# Patient Record
Sex: Female | Born: 1985 | Race: White | Hispanic: Yes | Marital: Married | Smoking: Never smoker
Health system: Southern US, Community
[De-identification: ages and names within clinical notes are randomized; demographics above are authoritative.]

## PROBLEM LIST (undated history)

## (undated) DIAGNOSIS — IMO0002 Reserved for concepts with insufficient information to code with codable children: Secondary | ICD-10-CM

## (undated) HISTORY — PX: NO PAST SURGERIES: SHX2092

## (undated) HISTORY — PX: LEEP: SHX91

## (undated) HISTORY — DX: Reserved for concepts with insufficient information to code with codable children: IMO0002

---

## 2005-03-07 ENCOUNTER — Inpatient Hospital Stay (HOSPITAL_COMMUNITY): Admission: AD | Admit: 2005-03-07 | Discharge: 2005-03-07 | Payer: Self-pay | Admitting: Obstetrics & Gynecology

## 2005-03-08 ENCOUNTER — Inpatient Hospital Stay (HOSPITAL_COMMUNITY): Admission: AD | Admit: 2005-03-08 | Discharge: 2005-03-09 | Payer: Self-pay | Admitting: Obstetrics & Gynecology

## 2005-03-29 ENCOUNTER — Emergency Department (HOSPITAL_COMMUNITY): Admission: EM | Admit: 2005-03-29 | Discharge: 2005-03-29 | Payer: Self-pay | Admitting: *Deleted

## 2005-03-30 ENCOUNTER — Emergency Department (HOSPITAL_COMMUNITY): Admission: EM | Admit: 2005-03-30 | Discharge: 2005-03-30 | Payer: Self-pay | Admitting: Emergency Medicine

## 2011-05-04 DIAGNOSIS — D069 Carcinoma in situ of cervix, unspecified: Secondary | ICD-10-CM | POA: Insufficient documentation

## 2011-07-14 ENCOUNTER — Ambulatory Visit (INDEPENDENT_AMBULATORY_CARE_PROVIDER_SITE_OTHER): Payer: Self-pay | Admitting: Obstetrics & Gynecology

## 2011-07-14 ENCOUNTER — Encounter: Payer: Self-pay | Admitting: *Deleted

## 2011-07-14 VITALS — BP 87/57 | HR 79 | Temp 97.0°F | Ht 63.5 in | Wt 118.3 lb

## 2011-07-14 DIAGNOSIS — D069 Carcinoma in situ of cervix, unspecified: Secondary | ICD-10-CM

## 2011-07-14 NOTE — Progress Notes (Signed)
History:  25 y.o. 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  No other GYN concerns. Spanish interpreter present. The following portions of the patient's history were reviewed and updated as appropriate: allergies, current medications, past family history, past medical history, past social history, past surgical history and problem list.   Objective:  Physical Exam Blood pressure 87/57, pulse 79, temperature 97 F (36.1 C), temperature source Oral, height 5' 3.5" (1.613 m), weight 118 lb 4.8 oz (53.661 kg). Gen: NAD Abd: Soft, nontender and nondistended Pelvic: Normal appearing external genitalia; normal appearing vaginal mucosa and cervix with small ectropion noted.  Normal discharge.  Small uterus, no palpable masses or adnexal tenderness.  Assessment & Plan:  Explained need for LEEP; explained procedure in detail.  Patient to watch LEEP video, Spanish handout about LEEP given to patient. Return to clinic for LEEP soon.

## 2011-07-14 NOTE — Patient Instructions (Signed)
Procedimiento de escisin electroquirrgica con asa (Loop Electrosurgical Excision Procedure) El procedimiento de escisin electroquirrgica con asa es la extirpacin de una porcin de la parte inferior del tero (cuello). El procedimento se realiza cuando hay cambios significativamente anormales en las clulas del cuello del tero. Estos cambios pueden causar cncer si se dejan sin tratar.   El procedimiento mismo slo demora algunos minutos. Generalmente se Electronics engineer del mdico. Se considera un procedimiento seguro para aquellas mujeres que desean o tratan de quedar embarazadas. Solo bajo raras circunstancias este procedimiento se realiza estando embarazada.  INFORME A SU MDICO:   Si est embarazada o no tuvo el ltimo perodo menstrual.   Alergias a alimentos o medicamentos.   Todos los [employer] [place], incluyendo vitaminas, hierbas, gotas oftlmicas, medicamentos de [place] y Control and instrumentation engineer.   Uso de corticoides (por va oral o cremas).   Problemas anteriores debido a anestsicos o a medicamentos que [employer] sensibilidad.   Cirugas ginecolgicas previas.   Antecedentes de hemorragias o cogulos sanguneos.   Infecciones recientes o actuales en la vagina (herpes, enfermedades de transmisin sexual).   Otros problemas de [place].  RIESGOS Y COMPLICACIONES  Sangrado.   Infecciones.   Lesiones en la vagina, la vejiga o el recto.   Obstruccin rara en la abertura del cuello que causa problemas durante la menstruacin (estenosis cervical).  ANTES DEL PROCEDIMIENTO   No tome aspirina ni anticoagulantes durante la semana previa al procedimiento, o segn le hayan indicado.   Consuma una comida ligera antes del procedimiento.   Consulte a su mdico si debe cambiar o suspender los medicamentos que toma habitualmente.   Le administrarn un analgsico 1  2 horas antes del procedimiento.  PROCEDIMIENTO   Se coloca un instrumento (espculo) en la vagina.  Esto le permite al mdico observar el cuello.   Se aplica tintura de yodo para encontrar la zona de las clulas anormales.   Se inyecta un medicamento para adormecer el cuello (anestsico local).    Se pasa electricidad a travs de una delgada asa de alambre que luego se [place] para extirpar (cauterizar) un pequeo segmento de la zona afectada.   Se utiliza una ligera electrocauterizacin para sellar los vasos sanguneos pequeos y Social research officer, government.   Aplicarn una pasta en la zona cauterizada para prevenir el sangrado.   La muestra de tejido se enva al laboratorio. Luego, se examina en el microscopio.  DESPUS DEL PROCEDIMIENTO   Pdale a alguna persona que la lleve hasta su casa.   Puede sentir un clico moderado a suave.   Puede notar una secrecin vaginal negra por la pasta usada para prevenir el sangrado. Esto es normal.   Observe si tiene un sangrado excesivo. Esto requiere atencin mdica inmediata.   Consulte con su mdico la fecha en que los resultados estarn disponibles. Asegrese de [employer].  Document Released: 05/05/2011 [facility] Patient Information 2012 [place], [place].

## 2011-07-28 ENCOUNTER — Ambulatory Visit (INDEPENDENT_AMBULATORY_CARE_PROVIDER_SITE_OTHER): Payer: Self-pay | Admitting: Family Medicine

## 2011-07-28 ENCOUNTER — Encounter: Payer: Self-pay | Admitting: Family Medicine

## 2011-07-28 ENCOUNTER — Other Ambulatory Visit (HOSPITAL_COMMUNITY)
Admission: RE | Admit: 2011-07-28 | Discharge: 2011-07-28 | Disposition: A | Discharge status: Home / Self Care | Payer: Self-pay | Source: Ambulatory Visit | Attending: Family Medicine | Admitting: Family Medicine

## 2011-07-28 DIAGNOSIS — D069 Carcinoma in situ of cervix, unspecified: Secondary | ICD-10-CM | POA: Insufficient documentation

## 2011-07-28 DIAGNOSIS — Z01812 Encounter for preprocedural laboratory examination: Secondary | ICD-10-CM

## 2011-07-28 LAB — POCT PREGNANCY, URINE: Preg Test, Ur: NEGATIVE

## 2011-07-28 NOTE — Patient Instructions (Signed)
Procedimiento de escisin electroquirrgica con asa - Cuidados posteriores  (Loop Electrosurgical Excision Procedure, Care After) Siga estas instrucciones durante las prximas semanas. Estas indicaciones le proporcionan informacin general acerca de cmo deber cuidarse despus del procedimiento. El mdico tambin podr darle instrucciones especficas. El tratamiento ha sido planificado segn las prcticas mdicas actuales, pero en algunos casos pueden ocurrir problemas. Comunquese con el mdico si tiene algn problema o tiene preguntas despus del procedimiento.  INSTRUCCIONES PARA EL CUIDADO EN EL HOGAR   No use tampones, no se d duchas vaginales ni tenga relaciones sexuales durante 2 semanas, o segn lo que le indique su mdico.   Comience con las actividades habituales si no tiene o tiene mnimo de clicos y Landscape architect, excepto que el mdico le indique lo contrario.   Tmese la temperatura si se siente enfermo. Anote la temperatura en un papel e informe a su mdico que tiene fiebre.   Tome todos los medicamentos segn le indic su mdico.   Cumpla con todas las visitas de control y los papanicolau, segn le indique su mdico.  SOLICITE ATENCIN MDICA DE INMEDIATO SI:   Tiene un sangrado ms abundante o que dura ms que el ciclo menstrual normal.   Tiene un sangrado de color rojo brillante.   Elimina cogulos de [place].   Tiene fiebre.   Siente clicos o el dolor no se alivia con Tourist information centre manager.   Siente dolor abdominal que no parece estar relacionado con la misma zona en que sinti los clicos y Chief Technology Officer.   Se siente mareada, dbil o se desmaya.   Comienza a Financial risk analyst al orinar u [employer].   Tiene una secrecin vaginal con mal olor.  ASEGRESE DE QUE:   Comprende estas instrucciones.   Controlar su enfermedad.   Solicitar ayuda de inmediato si no mejora o si empeora.  Document Released: 05/06/2011 [facility] Patient Information 2012 [place] [employer], [place].

## 2011-07-28 NOTE — Progress Notes (Addendum)
  LEEP PROCEDURE NOTE Pap smear and colposcopy reviewed.   Colpo Biopsy:  CIN3  Risks, benefits, alternatives, and limitations of procedure explained to patient, including pain, bleeding, infection, failure to remove abnormal tissue and failure to cure dysplasia, need for repeat procedures, damage to pelvic organs, cervical incompetence.  Role of HPV,cervical dysplasia and need for close followup was empasized. Informed written consent was obtained. All questions were answered. Time out performed.  ??Procedure: The patient was placed in lithotomy position and the bivalved coated speculum was placed in the patient's vagina. A grounding pad placed on the patient. Lugol's solution was applied to the cervix and areas of decreased uptake were noted from 6 o'clock to 1 o'clock.   Local anesthesia was administered via an intracervical block using 10cc of 2% Lidocaine with epinephrine. The suction was turned on and the Medium 1X Fisher Cone Biopsy Excisor on 50 Watts of cutting current was used to excise the area of decreased uptake and excise the entire transformation zone. Excellent hemostasis was achieved using roller ball coagulation set at 50 Watts coagulation current. Monsel's solution was then applied and the speculum was removed from the vagina. Specimens were sent to pathology. ?The patient tolerated the procedure well. Post-operative instructions given to patient, including instruction to seek medical attention for persistent bright red bleeding, fever, abdominal/pelvic pain, dysuria, nausea or vomiting. She was also told about the possibility of having copious yellow to black tinged discharge. She was counseled to avoid anything in the vagina (sex/douching/tampons) for 4-6 weeks. She has a  2-week post-operative check to review results and assess wound healing. Follow up in 4 months for repeat pap or as needed.  Dr Debroah Loop was present for the procedure.

## 2011-08-25 ENCOUNTER — Ambulatory Visit (INDEPENDENT_AMBULATORY_CARE_PROVIDER_SITE_OTHER): Payer: Self-pay | Admitting: Obstetrics and Gynecology

## 2011-08-25 ENCOUNTER — Encounter: Payer: Self-pay | Admitting: Advanced Practice Midwife

## 2011-08-25 VITALS — BP 109/68 | HR 100 | Temp 97.1°F | Resp 16 | Ht 63.0 in | Wt 122.6 lb

## 2011-08-25 DIAGNOSIS — Z23 Encounter for immunization: Secondary | ICD-10-CM

## 2011-08-25 DIAGNOSIS — Z9889 Other specified postprocedural states: Secondary | ICD-10-CM

## 2011-08-25 DIAGNOSIS — Z09 Encounter for follow-up examination after completed treatment for conditions other than malignant neoplasm: Secondary | ICD-10-CM

## 2011-08-25 MED ORDER — INFLUENZA VIRUS VACC SPLIT PF IM SUSP
0.5000 mL | INTRAMUSCULAR | Status: AC | PRN
  Administered 2011-08-25: 0.5 mL via INTRAMUSCULAR

## 2011-08-25 NOTE — Patient Instructions (Signed)
Conizacin cervical, Cuidado posterior (Conization of the Cervix, Care After) Estas instrucciones le darn informacin acerca de qu hacer al dejar el hospital. El profesional que la asiste podr darle instrucciones especficas. Si tiene problemas o surgen preguntas luego de recibir el alta, comunquese con su mdico.  CUIDADOS EN EL HOGAR   Est acompaada por alguien durante 2 a 4 das luego del procedimiento.   No conduzca por 24 horas.   Slo tome los medicamentos segn le haya indicado el profesional. No tome aspirina. Puede ocasionar hemorragias.   Tome duchas durante la primera semana. No tome baos, nade, ni utilice el jacuzzi hasta que el mdico la autorice.   No realice duchas vaginales, no utilice tampones ni tenga relaciones sexuales hasta que el mdico la autorice.   Haga reposo con frecuencia.   Espere una semana antes de retomar sus actividades normales.   No [place], empuje ni tire de nada que pese ms de 10 libras (4,5 kg, aproximadamente el peso de un galn de [place]).   No permanezca parada por mucho tiempo.   Limite los ascensos por escaleras a uno o [employer].   Si no puede ir al bao (constipacin), puede:   Tomar laxantes suaves, segn le haya indicado el mdico.   Agregar frutas y salvado a su dieta.   Beba una cantidad de lquido suficiente como para Pharmacologist la orina de tono claro o color amarillo plido.   Siga con su dieta habitual.   Cumpla con todas las visitas de control, segn le indique su mdico.  SOLICITE AYUDA DE INMEDIATO SI:   Tiene cogulos sanguneos, un sangrado ms abundante que el periodo menstrual, o si el sangrado es de color rojo brillante.   Su temperatura es de 102 F (38.9 C) o mayor.   Se desmaya.   Siente dolor al orinar u observa sangre en la orina.   Comienza a vomitar.   Los [employer].   No consigue Engineer, materials con la medicacin.   Aparece una erupcin cutnea.   Se siente mareada o siente  vrtigo.   Tiene Programme researcher, broadcasting/film/video (nuseas).   Brett Fairy secrecin vaginal con mal olor.   Tiene una reaccin anormal o alrgica al medicamento.   Necesita analgsicos ms fuertes.  ASEGRESE DE QUE:   Comprende estas instrucciones.   Controlar su enfermedad.   Solicitar ayuda de inmediato si no mejora o empeora.  Document Released: 09/25/2010 Document Revised: 05/05/2011 [facility] Patient Information 2012 [place], [place].

## 2011-08-25 NOTE — Progress Notes (Signed)
Leslie Leep Jimenez-Velez25 y.o.G1P1001 Chief Complaint  Patient presents with  . Follow-up    LEEP results    SUBJECTIVE  HPI: Had light bleeding and spotting since procedure. Denies pain or resumption sexual activity.    Past Medical History  Diagnosis Date  . Abnormal Pap smear    History reviewed. No pertinent past surgical history. History   Social History  . Marital Status: Married    Spouse Name: N/A    Number of Children: N/A  . Years of Education: N/A   Occupational History  . Not on file.   Social History Main Topics  . Smoking status: Never Smoker   . Smokeless tobacco: Never Used  . Alcohol Use: No  . Drug Use: No  . Sexually Active: Yes    Birth Control/ Protection: Injection     on depo provera   Other Topics Concern  . Not on file   Social History Narrative  . No narrative on file   Current Outpatient Prescriptions on File Prior to Visit  Medication Sig Dispense Refill  . medroxyPROGESTERone (DEPO-PROVERA) 150 MG/ML injection Inject 150 mg into the muscle every 3 (three) months.         No current facility-administered medications on file prior to visit.   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

## 2012-09-06 NOTE — L&D Delivery Note (Signed)
Delivery Note At 10:38 AM a viable female was delivered via Vaginal, Spontaneous Delivery (Presentation: Middle Occiput Anterior).  APGAR: 9, 9; weight 8 lb 4.8 oz (3765 g).   Placenta status: Intact, Retained Manual removal.  Cord: 3 vessels with the following complications: None.   Anesthesia: Epidural  Episiotomy: None Lacerations: 1st degree;Labial Suture Repair: 3.0 vicryl Est. Blood Loss (mL): 500  Mom to postpartum.  Baby to maternal chest.  Humberto Leep Jimenez-Velez pushed well to deliver viable female infant in LOA position with left hand at face over intact perineum. No nuchal cord present. Infant shoulders and body delivered without difficulty or delay. Infant vigorous, crying and placed on maternal abdomen. 1st degree labial tear bleeding and was repaired with 3.0 Vicryl in the usual fashion. Cord clamping delayed until cessation of cord pulsing. Cord then clamped and cut by FOB. Third stage of labor actively managed with gentle traction and oxytocin. After 25 minutes placenta had still not delivered. Placenta was then manually removed by Dr. Reola Calkins and was intact. Brisk bleeding noted initially with boggy fundus which responded to bimanual massage. Methergine 0.2 mg IM administered prophylactically. Mother and baby stable at conclusion of delivery. Counts correct and verified.   Cowart, Ryann 06/28/2013, 12:42 PM    I was present for and supervised the delivery of this newborn and agree with above documentation in the resident's note. Pt had a prolonged third stage >30 min and required manual extraction of the anterior fundal placenta.  Pt tolerated this well. Post extraction, took longer to clamp down the uterus and required bimanual massage.   Rulon Abide, M.D. [facility] Fellow 06/28/2013 1:28 PM

## 2012-10-29 ENCOUNTER — Encounter (HOSPITAL_COMMUNITY): Payer: Self-pay | Admitting: Emergency Medicine

## 2012-10-29 ENCOUNTER — Emergency Department (HOSPITAL_COMMUNITY)
Admission: EM | Admit: 2012-10-29 | Discharge: 2012-10-30 | Disposition: A | Discharge status: Home / Self Care | Payer: Self-pay | Attending: Emergency Medicine | Admitting: Emergency Medicine

## 2012-10-29 DIAGNOSIS — M545 Low back pain, unspecified: Secondary | ICD-10-CM | POA: Insufficient documentation

## 2012-10-29 DIAGNOSIS — O200 Threatened abortion: Secondary | ICD-10-CM | POA: Insufficient documentation

## 2012-10-29 DIAGNOSIS — R748 Abnormal levels of other serum enzymes: Secondary | ICD-10-CM | POA: Insufficient documentation

## 2012-10-29 LAB — PREGNANCY, URINE: Preg Test, Ur: POSITIVE — AB

## 2012-10-29 NOTE — ED Notes (Signed)
Pt alert, arrives from home, c/o vaginal bleeding ? + pregnancy, LMP 09/26/12, states sharp lower abd pain, radiated to back

## 2012-10-30 ENCOUNTER — Emergency Department (HOSPITAL_COMMUNITY): Payer: Self-pay

## 2012-10-30 LAB — COMPREHENSIVE METABOLIC PANEL
ALT: 59 U/L — ABNORMAL HIGH (ref 0–35)
Alkaline Phosphatase: 70 U/L (ref 39–117)
CO2: 22 mEq/L (ref 19–32)
Chloride: 102 mEq/L (ref 96–112)
GFR calc Af Amer: >90 mL/min (ref >90)
GFR calc non Af Amer: >90 mL/min (ref >90)
Glucose, Bld: 88 mg/dL (ref 70–99)
Potassium: 3.3 mEq/L — ABNORMAL LOW (ref 3.5–5.1)
Sodium: 133 mEq/L — ABNORMAL LOW (ref 135–145)
Total Protein: 6.4 g/dL (ref 6.0–8.3)

## 2012-10-30 LAB — CBC WITH DIFFERENTIAL/PLATELET
Lymphocytes Relative: 24 % (ref 12–46)
Lymphs Abs: 2.1 10*3/uL (ref 0.7–4.0)
Neutro Abs: 5.7 10*3/uL (ref 1.7–7.7)
Neutrophils Relative %: 66 % (ref 43–77)
Platelets: 157 10*3/uL (ref 150–400)
RBC: 4.3 MIL/uL (ref 3.87–5.11)
WBC: 8.8 10*3/uL (ref 4.0–10.5)

## 2012-10-30 LAB — WET PREP, GENITAL: Clue Cells Wet Prep HPF POC: NONE SEEN

## 2012-10-30 MED ORDER — PRENATAL COMPLETE 14-0.4 MG PO TABS: 1.0000 | ORAL_TABLET | Freq: Two times a day (BID) | ORAL | Status: DC

## 2012-10-30 MED ORDER — POTASSIUM CHLORIDE CRYS ER 20 MEQ PO TBCR
20.0000 meq | EXTENDED_RELEASE_TABLET | Freq: Once | ORAL | Status: AC
  Administered 2012-10-30: 20 meq via ORAL
  Filled 2012-10-30: qty 1

## 2012-10-30 NOTE — ED Provider Notes (Signed)
Medical screening examination/treatment/procedure(s) were performed by non-physician practitioner and as supervising physician I was immediately available for consultation/collaboration.   Hanley Seamen, MD 10/30/12 [phone]

## 2012-10-30 NOTE — ED Provider Notes (Signed)
History     CSN: [id number]  Arrival date & time 10/29/12  2259   First MD Initiated Contact with Patient 10/29/12 2329      Chief Complaint  Patient presents with  . Vaginal Bleeding    (Consider location/radiation/quality/duration/timing/severity/associated sxs/prior treatment) HPI  Patient presents to the emergency department with complaints of vaginal bleeding. She says she had a positive pregnancy test at home but began having some suprapubic and low back cramping with about a teaspoon of blood. It scared her so she came to the ER for evaluation. She denies having any fevers, vaginal discharge, vomiting, weakness. She denies seeing any tissue in the blood. She is healthy at baseline and has had a successful to term pregnancy in the past. LMP 09/26/12. nad vss  Past Medical History  Diagnosis Date  . Abnormal Pap smear     History reviewed. No pertinent past surgical history.  Family History  Problem Relation Age of Onset  . Diabetes Paternal Grandfather   . Diabetes Paternal Grandmother   . Diabetes Father     patients father    History  Substance Use Topics  . Smoking status: Never Smoker   . Smokeless tobacco: Never Used  . Alcohol Use: No    OB History   Grav Para Term Preterm Abortions TAB SAB Ect Mult Living   1 1 1  0 0 0 0 0 0 1      Review of Systems  Review of Systems  Gen: no weight loss, fevers, chills, night sweats  Eyes: no discharge or drainage, no occular pain or visual changes  Nose: no epistaxis or rhinorrhea  Mouth: no dental pain, no sore throat  Neck: no neck pain  Lungs:No wheezing, coughing or hemoptysis CV: no chest pain, palpitations, dependent edema or orthopnea  Abd: no abdominal pain, nausea, vomiting  GU: no dysuria or gross hematuria + vaginal bleeding and suprapubic/back cramping MSK:  No abnormalities  Neuro: no headache, no focal neurologic deficits  Skin: no abnormalities Psyche: negative.   Allergies  Review of  patient's allergies indicates no known allergies.  Home Medications   Current Outpatient Rx  Name  Route  Sig  Dispense  Refill  . Prenatal Vit-Fe Fumarate-FA (PRENATAL COMPLETE) 14-0.4 MG TABS   Oral   Take 1 tablet by mouth 2 (two) times daily.   60 each   2     BP 100/51  Pulse 81  Temp(Src) 98 F (36.7 C) (Oral)  Resp 16  SpO2 100%  LMP 09/26/2012  Physical Exam  Nursing note and vitals reviewed. Constitutional: She appears well-developed and well-nourished. No distress.  HENT:  Head: Normocephalic and atraumatic.  Eyes: Pupils are equal, round, and reactive to light.  Neck: Normal range of motion. Neck supple.  Cardiovascular: Normal rate and regular rhythm.   Pulmonary/Chest: Effort normal.  Abdominal: Soft.  Genitourinary: Uterus is enlarged. Cervix exhibits no motion tenderness and no discharge. There is bleeding (very small amount of old blood noted in vaginal vault) around the vagina. No erythema or tenderness around the vagina. No foreign body around the vagina. No signs of injury around the vagina. No vaginal discharge found.  Neurological: She is alert.  Skin: Skin is warm and dry.    ED Course  Procedures (including critical care time)  Labs Reviewed  PREGNANCY, URINE - Abnormal; Notable for the following:    Preg Test, Ur POSITIVE (*)    All other components within normal limits  HCG, QUANTITATIVE, PREGNANCY -  Abnormal; Notable for the following:    hCG, Beta Chain, Quant, S 8224 (*)    All other components within normal limits  CBC WITH DIFFERENTIAL - Abnormal; Notable for the following:    HCT 35.5 (*)    All other components within normal limits  COMPREHENSIVE METABOLIC PANEL - Abnormal; Notable for the following:    Sodium 133 (*)    Potassium 3.3 (*)    AST 41 (*)    ALT 59 (*)    All other components within normal limits  WET PREP, GENITAL  GC/CHLAMYDIA PROBE AMP  ABO/RH   US Ob Comp Less 14 Wks  10/30/2012  *RADIOLOGY REPORT*   Clinical Data: Vaginal bleeding during pregnancy.  Estimated gestational age by LMP is 5 weeks 5 days.  Quantitative beta HCG is 8224.  OBSTETRIC <14 WK [place] AND TRANSVAGINAL OB US  Technique:  Both transabdominal and transvaginal ultrasound examinations were performed for complete evaluation of the gestation as well as the maternal uterus, adnexal regions, and pelvic cul-de-sac.  Transvaginal technique was performed to assess early pregnancy.  Comparison:  None.  Intrauterine gestational sac:  A single intrauterine gestational sac is present with regular contours.  Small hypoechoic collection external to the gestational sac inferiorly consistent with small subchorionic hemorrhage. Yolk sac: The yolk sac is visualized. Embryo: Fetal pole is not identified. Cardiac Activity: Fetal cardiac activity is not identified.  MSD: 15.8 mm  6 w 3 d         [place] EDC: 06/22/2013  Maternal uterus/adnexae: The uterus is retroverted.  No myometrial masses.  The right ovary measures 1.9 x 3.4 x 2.2 cm.  Normal follicular changes. Suggestion of complex cyst measuring about 15 mm diameter.  This may represent corpus luteum cyst.  The left ovary measures 1.4 x 2.8 x 1.6 cm.  Normal follicular changes.  No abnormal adnexal masses.  No free pelvic fluid collections.  IMPRESSION: Single intrauterine gestational sac and yolk sac identified. Estimated gestational age by mean sac diameter is 6 weeks 3 days. Probable early intrauterine gestational sac, but no fetal pole, or cardiac activity yet visualized.  Recommend follow-up quantitative B-HCG levels and follow-up US in 14 days to confirm and assess viability. This recommendation follows SRU consensus guidelines: Diagnostic Criteria for Nonviable Pregnancy Early in the First Trimester.  Malva Limes Med 2013; 010:2725-36.   Original Report Authenticated By: Burman Nieves, M.D.    US Ob Transvaginal  10/30/2012  *RADIOLOGY REPORT*  Clinical Data: Vaginal bleeding during pregnancy.  Estimated  gestational age by LMP is 5 weeks 5 days.  Quantitative beta HCG is 8224.  OBSTETRIC <14 WK [place] AND TRANSVAGINAL OB US  Technique:  Both transabdominal and transvaginal ultrasound examinations were performed for complete evaluation of the gestation as well as the maternal uterus, adnexal regions, and pelvic cul-de-sac.  Transvaginal technique was performed to assess early pregnancy.  Comparison:  None.  Intrauterine gestational sac:  A single intrauterine gestational sac is present with regular contours.  Small hypoechoic collection external to the gestational sac inferiorly consistent with small subchorionic hemorrhage. Yolk sac: The yolk sac is visualized. Embryo: Fetal pole is not identified. Cardiac Activity: Fetal cardiac activity is not identified.  MSD: 15.8 mm  6 w 3 d         [place] EDC: 06/22/2013  Maternal uterus/adnexae: The uterus is retroverted.  No myometrial masses.  The right ovary measures 1.9 x 3.4 x 2.2 cm.  Normal follicular changes. Suggestion of complex  cyst measuring about 15 mm diameter.  This may represent corpus luteum cyst.  The left ovary measures 1.4 x 2.8 x 1.6 cm.  Normal follicular changes.  No abnormal adnexal masses.  No free pelvic fluid collections.  IMPRESSION: Single intrauterine gestational sac and yolk sac identified. Estimated gestational age by mean sac diameter is 6 weeks 3 days. Probable early intrauterine gestational sac, but no fetal pole, or cardiac activity yet visualized.  Recommend follow-up quantitative B-HCG levels and follow-up US in 14 days to confirm and assess viability. This recommendation follows SRU consensus guidelines: Diagnostic Criteria for Nonviable Pregnancy Early in the First Trimester.  Malva Limes Med 2013; 161:0960-45.   Original Report Authenticated By: Burman Nieves, M.D.      1. Threatened miscarriage in early pregnancy   2. Elevated liver enzymes       MDM  Labs results are positive for mildly elevated liver enzymes and a mildly low  potassium.   Ob US shows a 6 week 3 day pregnancy with no detectable fetal heart tones as of yet. Discussed case with Dr. Read Drivers who says it is threatened abortion and she needs very close follow-up and to have her Hcgs followed closely.  Wet prep shows no infection. Pt has been advised of the symptoms that warrant their return to the ED. Patient has voiced understanding and has agreed to follow-up with the PCP or specialist.         Dorthula Matas, PA 10/30/12 [phone]

## 2012-10-30 NOTE — ED Notes (Signed)
US at bedside

## 2012-10-31 LAB — GC/CHLAMYDIA PROBE AMP: GC Probe RNA: NEGATIVE

## 2012-12-04 ENCOUNTER — Other Ambulatory Visit: Payer: Self-pay | Admitting: *Deleted

## 2012-12-04 DIAGNOSIS — O200 Threatened abortion: Secondary | ICD-10-CM

## 2012-12-05 ENCOUNTER — Ambulatory Visit (HOSPITAL_COMMUNITY)
Admission: RE | Admit: 2012-12-05 | Discharge: 2012-12-05 | Disposition: A | Discharge status: Home / Self Care | Payer: Self-pay | Source: Ambulatory Visit | Attending: Family Medicine | Admitting: Family Medicine

## 2012-12-05 DIAGNOSIS — Z3689 Encounter for other specified antenatal screening: Secondary | ICD-10-CM | POA: Insufficient documentation

## 2012-12-05 DIAGNOSIS — O200 Threatened abortion: Secondary | ICD-10-CM

## 2012-12-05 DIAGNOSIS — O3680X0 Pregnancy with inconclusive fetal viability, not applicable or unspecified: Secondary | ICD-10-CM | POA: Insufficient documentation

## 2012-12-05 DIAGNOSIS — O209 Hemorrhage in early pregnancy, unspecified: Secondary | ICD-10-CM | POA: Insufficient documentation

## 2012-12-13 ENCOUNTER — Ambulatory Visit (INDEPENDENT_AMBULATORY_CARE_PROVIDER_SITE_OTHER): Payer: Self-pay | Admitting: Advanced Practice Midwife

## 2012-12-13 ENCOUNTER — Other Ambulatory Visit: Payer: Self-pay | Admitting: Family Medicine

## 2012-12-13 ENCOUNTER — Encounter: Payer: Self-pay | Admitting: Advanced Practice Midwife

## 2012-12-13 VITALS — BP 117/74 | Temp 97.0°F | Wt 123.0 lb

## 2012-12-13 DIAGNOSIS — O2390 Unspecified genitourinary tract infection in pregnancy, unspecified trimester: Secondary | ICD-10-CM

## 2012-12-13 DIAGNOSIS — Z3481 Encounter for supervision of other normal pregnancy, first trimester: Secondary | ICD-10-CM

## 2012-12-13 DIAGNOSIS — Z3491 Encounter for supervision of normal pregnancy, unspecified, first trimester: Secondary | ICD-10-CM

## 2012-12-13 DIAGNOSIS — O209 Hemorrhage in early pregnancy, unspecified: Secondary | ICD-10-CM | POA: Insufficient documentation

## 2012-12-13 DIAGNOSIS — Z3480 Encounter for supervision of other normal pregnancy, unspecified trimester: Secondary | ICD-10-CM | POA: Insufficient documentation

## 2012-12-13 DIAGNOSIS — O2341 Unspecified infection of urinary tract in pregnancy, first trimester: Secondary | ICD-10-CM

## 2012-12-13 LAB — POCT URINALYSIS DIP (DEVICE)
Glucose, UA: NEGATIVE mg/dL
Specific Gravity, Urine: >=1.03 (ref 1.005–1.030)
Urobilinogen, UA: 1 mg/dL (ref 0.0–1.0)

## 2012-12-13 MED ORDER — NITROFURANTOIN MONOHYD MACRO 100 MG PO CAPS: 100.0000 mg | ORAL_CAPSULE | Freq: Two times a day (BID) | ORAL | Status: AC

## 2012-12-13 NOTE — Patient Instructions (Signed)
Embarazo  Primer trimestre  (Pregnancy - First Trimester)  Durante el acto sexual, millones de espermatozoides entran en la vagina. Slo 1 espermatozoide penetra y fertiliza al vulo mientras se encuentra en la trompa de [place]. Una semana ms tarde, el vulo fertilizado se implanta en la pared del tero. Un embrin comienza a desarrollarse para ser un beb. A las 6 a 8 semanas se forman los ojos y la cara y los latidos del corazn se pueden ver en la ecografa. Al final de las 12 semanas (primer trimestre) todos los rganos del beb estn formados. Ahora que est embarazada, querr hacer todo lo que est a su alcance para tener un beb sano. Dos de las cosas ms importantes son: Winferd Humphrey buena atencin prenatal y seguir las indicaciones del profesional que la asiste. La atencin prenatal incluye toda la asistencia mdica que usted recibe antes del nacimiento del beb. Se lleva a cabo para prevenir y tratar problemas durante el [address] [place]. EXAMENES PRENATALES   Durante las visitas prenatales se [employer], la presin arterial y se solicitan anlisis de [place]. Esto se hace para asegurarse de que usted est sana y el embarazo progrese normalmente.  Una mujer embarazada debe aumentar de 25 a 35 libras durante el embarazo. Sin embargo, si usted tiene sobrepeso o [place], su mdico Administrator, Civil Service.  El podr hacerle preguntas y responder todas las que usted le haga.  Durante los exmenes prenatales se solicitan anlisis de [place], cultivos del [place], un [place] y otros anlisis necesarios. Estas pruebas se realizan para controlar su salud y la del beb. Se recomienda que se haga la prueba para el diagnstico del VIH, con su autorizacin. Este es el virus que causa el [place]. Estas pruebas se realizan porque existen medicamentos que podran administrarle para prevenir que el beb nazca con esta infeccin, si usted estuviera infectada y no lo supiera. Los [employer] de sangre tambin  se Radiographer, therapeutic para Warehouse manager tipo de [place], si tuvo infecciones previas y Chief Operating Officer sus niveles en la sangre (hemoglobina).  Tener un recuento bajo de hemoglobina (anemia) es comn durante Firefighter. Para prevenirla, se administran hierro y vitaminas. En una etapa ms avanzada del [place], le indicarn exmenes de sangre para saber si tiene diabetes, junto con otros anlisis, en caso de que [place]. Es necesario que se haga las pruebas para asegurarse de que usted y el beb estn bien.  Es posible que necesite otras pruebas adicionales. CAMBIOS DURANTE EL PRIMER TRIMESTRE (LOS TRES PRIMEROS MESES DEL EMBARAZO).  Su organismo atravesar numerosos cambios Academic librarian. Estos pueden variar de Neomia Dear persona a otra. Converse con el mdico acerca los cambios que usted nota y que la preocupan. Ellos son:   El perodo menstrual se detiene.  El vulo y los espermatozoides llevan los genes que determinan cmo seremos. Sus genes y los de su pareja forman el beb. Los genes del varn determinan si ser un nio o una nia.  La circunferencia de la cintura va a ir aumentando y podr sentirse hinchada.  Puede tener Programme researcher, broadcasting/film/video (nuseas) y vmitos. Si no puede controlar los vmitos, consulte a su mdico.  Sus mamas comenzarn a agrandarse y sensibilizarse.  Los pezones pueden sobresalir ms y ser ms oscuros.  Tendr necesidad de orinar ms. El dolor al orinar puede significar que usted tiene una infeccin de la vejiga.  Se cansar con facilidad.  Prdida del apetito.  Sentir un fuerte deseo de consumir ciertos alimentos.  Al principio, usted puede ganar o perder un par de kilos.  Podr tener cambios emocionales de un da a otro (entusiasmo por estar embarazada o preocupacin por Firefighter y el beb).  Tendr sueos ms vvidos y extraos. INSTRUCCIONES PARA EL CUIDADO EN EL HOGAR   Es muy importante evitar el cigarrillo, el alcohol y los frmacos no recetados  Academic librarian. Estas sustancias afectan la formacin y el desarrollo del beb. Evite los productos qumicos durante el embarazo para Games developer salud del beb.  Comience las consultas prenatales alrededor de la 12 semana de [place]. Generalmente se programan cada mes al principio y se hacen ms frecuentes en los 2 ltimos meses antes del parto. Cumpla con las citas de control. Siga las indicaciones del mdico con respecto al uso de [place], los anlisis y pruebas de [place], los ejercicios y Psychologist, forensic.  Durante el embarazo debe obtener nutrientes para usted y para su beb. Consuma alimentos balanceados. Elija alimentos como carne, pescado, [place] y otros productos lcteos descremados, vegetales, frutas, panes integrales y cereales. El [employer] informar cul es el aumento de peso ideal.  Las nuseas matinales pueden aliviarse si come algunas galletitas saladas en la cama. Coma dos galletitas antes de levantarse por la maana. Tambin puede comer galletitas sin sal.  Hacer 4 o 5 comidas pequeas en lugar de 3 comidas grandes por da tambin puede [employer] nuseas y los vmitos.  Beber lquidos [place] comidas en lugar de tomarlos durante las comidas tambin puede ayudar a Optician, dispensing las nuseas y los vmitos.  Puede continuar teniendo [employer] [place] [place] [employer] durante todo el embarazo si no hay otros problemas. Los problemas pueden ser una prdida precoz (prematura) de lquido amnitico, sangrado vaginal, o dolor en el vientre (abdominal).  Realice [employer], si no tiene restricciones. Consulte con su mdico o terapeuta fsico si no est segura de algunos de sus ejercicios. El mayor aumento de peso se producir en los ltimos 2 trimestres del Psychiatrist. El ejercicio le ayudar a:  Engineering geologist.  Mantenerse en forma.  Prepararse para el parto.  La ayudar a perder el peso del embarazo despus de que nazca su beb.  Use un buen sostn o como los que se usan  para hacer deportes para Paramedic la sensibilidad de las [place]. Tambin puede serle til si lo [place] mientras duerme.  Consulte cuando puede comenzar con las clases de pre parto. Comience con las clases cuando estn disponibles.  No utilice la baera con agua caliente, baos turcos y saunas.  Colquese el cinturn de seguridad cuando conduzca. Este la proteger a usted y al beb en caso de accidente.  Evite comer carne cruda y el contacto con los utensilios y desperdicios de los gatos. Estos elementos contienen grmenes que pueden causar defectos de nacimiento en el beb.  El primer trimestre es un buen momento para visitar a su dentista y Software engineer. Es importante mantener los dientes limpios. Use un cepillo de dientes suave y cepllese con ms suavidad durante el [employer].  Pida ayuda si tienen necesidades financieras, teraputicas o nutricionales. El profesional podr ayudarla con respecto a estas necesidades, o derivarla a otros especialistas.  No tome medicamentos o hierbas excepto aquellos que Fish farm manager.  Informe a su mdico si sufre violencia familiar mental o fsica.  Haga una lista de nmeros de telfono de Associate Professor de la familia, los amigos, el hospital y los departamentos de polica y bomberos.  Escriba sus  preguntas. Llvelas cuando concurra a su visita prenatal.  No se haga duchas vaginales.  No cruce las piernas.  Si usted tiene que estar parada por largos perodos de [place], gire los pies o de pequeos pasos en crculo.  Es posible que tenga ms secreciones vaginales que puedan requerir una toalla higinica. No use tampones o toallas higinicas perfumadas. EL CONSUMO DE MEDICAMENTOS Y FRMACOS DURANTE EL EMBARAZO   Tome las vitaminas para la etapa prenatal tal como se le indic. Las vitaminas deben contener un miligramo de cido flico. Guarde todas las vitaminas fuera del alcance de los nios. La ingestin de slo un par de vitaminas o  tabletas que contengan hierro pueden ocasionar la [employer] en un beb o en un nio pequeo.  Evite el uso de [employer] Mutual [employer] Omaha, incluyendo hierbas, medicamentos de [place], sin receta o que no hayan sido sugeridos por su mdico. Slo tome medicamentos de venta libre o medicamentos recetados para Chief Technology Officer, Environmental health practitioner o fiebre como lo indique su mdico. No tome aspirina, ibuprofeno o naproxeno excepto que su mdico se lo indique.  Infrmele al profesional si consume medicamentos de hierbas.  El alcohol se relaciona con ciertos defectos congnitos. Incluye el sndrome de alcoholismo fetal. Debe evitar absolutamente el consumo de alcohol, en cualquier forma. El fumar causar baja tasa de natalidad y bebs prematuros.  Las drogas ilegales o de la calle son muy perjudiciales para el beb. Estn absolutamente prohibidas. Un beb que nace de [employer] Express, ser adicto al nacer. Ese beb tendr los mismos sntomas de abstinencia que un adulto.  Informe a su mdico acerca de los medicamentos que ha tomado y el motivo por el que los tom. EL ABORTO ESPONTNEO ES COMN DURANTE EL EMBARAZO  Esto no significa que haya hecho algo mal. No es un motivo para preocuparse en caso de un nuevo embarazo. El profesional que la asiste la ayudar si tiene preguntas para formular. Si tiene un aborto espontneo podr requerir Futures trader.  SOLICITE ATENCIN MDICA SI:  Tiene preguntas o preocupaciones relacionadas con el embarazo. Es mejor que llame para formular las preguntas si no puede esperar hasta la prxima visita, que sentirse preocupada por ellas.  SOLICITE ATENCIN MDICA DE INMEDIATO SI:   La temperatura oral le sube a ms de 102 F (38.9 C) o lo que su mdico le indique.  Tiene una prdida de lquido por la vagina (canal de parto). Si sospecha una ruptura de las [place], tmese la temperatura y llame al profesional para informarlo sobre esto.  Observa unas pequeas manchas o una hemorragia  vaginal. Notifique al profesional acerca de la cantidad y de cuntos apsitos est utilizando.  Presenta un olor desagradable en la secrecin vaginal y observa un cambio en el color.  Contina con las nuseas y no obtiene alivio de los remedios indicados. Vomita sangre o algo similar a la borra del caf.  Pierde ms de 2 libras (1 Kg) en una semana.  Aumenta ms de 1 Kg en una semana y [address], las manos, los pies o las piernas hinchados.  Aumenta ms de 2,5 Kg en una semana (aunque no tenga las manos, pies, piernas o el rostro hinchados).  Ha estado expuesta a la rubola y no ha sufrido la enfermedad.  Ha estado expuesta a la quinta enfermedad o a la varicela.  Siente dolor en el vientre (abdominal). Las [employer] en el ligamento redondo son Neomia Dear causa benigna frecuente de dolor abdominal durante el embarazo. El  profesional que la asiste deber evaluarla.  Presenta dolor de Renne Musca, diarrea, dolor al orinar o le falta la respiracin.  Se cae, se ve involucrada en un accidente automovilstico o sufre algn tipo de traumatismo.  En su hogar hay violencia mental o fsica. Document Released: 06/02/2005 Document Revised: 02/22/2012 [facility] Patient Information 2013 [place], [place]. Hemorragia vaginal durante el embarazo, primer trimestre (Vaginal Bleeding During Pregnancy, First Trimester) Durante el embarazo es relativamente frecuente que se presente una pequea hemorragia (manchas). Esta situacin generalmente mejora por s misma. Existen muchas causas para la hemorragia o prdidas durante el principio del Psychiatrist. Algunas hemorragias pueden estar relacionadas al [employer] Lots y otras no. Si aparecen retortijones con la hemorragia es ms serio y preocupante. Informe al mdico si tiene cualquier tipo de hemorragia vaginal.  CAUSAS  En la mayora de los [place] es normal.  El embarazo finaliza (aborto espontneo).  El Psychiatrist podra terminar (amenaza de aborto  espontneo).  Infeccin o inflamacin del tero.  Tumores (plipos) en el tero.  El embarazo sucede por fuera del tero y en las trompas de falopio (embarazo ectpico).  Muchos quistes pequeos en el tero en lugar del tejido del embarazo (embarazo molar). SNTOMAS Hemorragia o goteo vaginal con o sin retortijones. DIAGNSTICO Para evaluar el embarazo, el mdico podr:  Education officer, environmental un examen plvico.  Tomar anlisis de [place].  Realizar una prueba de [place]. Es importante seguir las indicaciones del profesional que la asiste.  TRATAMIENTO  Evaluacin del embarazo con anlisis de [place] y [place].  Reposo en cama (slo levantarse para ir al bao).  Inmunizacin con Rho-gam si la madre es Rh negativo y el padre es Rh positivo. INSTRUCCIONES PARA EL CUIDADO DOMICILIARIO  Si el mdico le indica reposo en cama, usted necesitar hacer algunos arreglos para que otra persona se ocupe del cuidado de los nios y de otras responsabilidades adicionales. Sin embargo, podr autorizarla a Building surveyor.  Lleve un registro de la cantidad y la saturacin de las toallas higinicas que Landscape architect. Escriba esta informacin:  No use tampones. No utilice duchas vaginales.  No tenga relaciones sexuales u orgasmos hasta que el mdico la autorice.  Guarde una muestra de los tejidos para que el profesional lo inspeccione.  Tome medicamentos para el dolor slo con el permiso del mdico.  No tome aspirina, ya que puede causar hemorragias. SOLICITE ATENCIN MDICA INMEDIATAMENTE SI:  Siente calambres intensos en el estmago, en la espalda o en el vientre (abdomen).  Usted tiene una temperatura oral de ms de 102 F (38.9 C) y no puede controlarla con medicamentos.  Elimina cogulos o tejidos grandes.  La hemorragia aumenta o se siente mareada dbil o tiene episodios de desmayos.  Comienza a sentir escalofros.  Tiene una prdida de lquido por la vagina.  No  puede mover el intestino. Podra tratarse de un embarazo ectpico. Document Released: 06/02/2005 Document Revised: 11/15/2011 [facility] Patient Information 2013 [place], [place].

## 2012-12-13 NOTE — Progress Notes (Signed)
Pulse- 90 Patient reports mild pelvic/lower abdominal pressure. Patient reporting bright red bleeding every day, "sometimes a little bit sometimes more", reports only using panty liners; please see ED visit note from 2/24

## 2012-12-13 NOTE — Progress Notes (Signed)
Ongoing intermittent bleeding, no cramping, "just pressure". Was seen with bleeding at [facility] on 2/23, u/s showed 6.3 week IUGS and yolk sac, no fetal pole yet. Moderate Hgb in urine - rx Macrobid and urine culture sent. New OB labs today. Genetic screening discussed, undecided. No intercourse until bleeding resolves. Pap today.   Subjective:    Leslie Villarreal is a G2P1001 [redacted]w[redacted]d being seen today for her first obstetrical visit.  Her obstetrical history is significant for prior uncomplicated pregnancy. Patient does intend to breast feed. Pregnancy history fully reviewed.  Patient reports bleeding and no cramping.  H/O CINII and LEEP in 2012, no pap since.   Filed Vitals:   12/13/12 0849  BP: 117/74  Temp: 97 F (36.1 C)  Weight: 123 lb (55.792 kg)    HISTORY: OB History   Grav Para Term Preterm Abortions TAB SAB Ect Mult Living   2 1 1  0 0 0 0 0 0 1     # Outc Date GA Lbr Len/2nd Wgt Sex Del Anes PTL Lv   1 TRM 7/06 [redacted]w[redacted]d  7lb(3.175kg) F SVD   Yes   2 CUR              Past Medical History  Diagnosis Date  . Abnormal Pap smear    Past Surgical History  Procedure Laterality Date  . Leep     Family History  Problem Relation Age of Onset  . Diabetes Paternal Grandfather   . Diabetes Paternal Grandmother   . Diabetes Father     patients father     Exam    Uterus:   12 week size  Pelvic Exam:    Perineum: Normal Perineum   Vulva: normal   Vagina:  normal mucosa, no blood       Cervix: anteverted and closed   Adnexa: normal adnexa and no mass, fullness, tenderness   Bony Pelvis: average  System:     Skin: normal coloration and turgor, no rashes    Neurologic: oriented, normal   Extremities: normal strength, tone, and muscle mass       Mouth/Teeth mucous membranes moist, pharynx normal without lesions       Cardiovascular: regular rate and rhythm   Respiratory:  appears well, vitals normal, no respiratory distress, acyanotic, normal RR   Abdomen: soft,  non-tender; bowel sounds normal; no masses,  no organomegaly   Urinary: urethral meatus normal      Assessment:    Pregnancy: G2P1001 Patient Active Problem List  Diagnosis  . CIN III (cervical intraepithelial neoplasia grade III) with severe dysplasia  . Supervision of normal intrauterine pregnancy in multigravida  . First trimester bleeding  Hematuria      Plan:     Initial labs drawn. Pap today. GC/CT negative in ED.  Prenatal vitamins. Problem list reviewed and updated. Genetic Screening discussed Quad Screen: undecided.  Ultrasound discussed; fetal survey: will schedule at 18-20 weeks.  Follow up in 4 weeks. Bleeding precautions, pelvic rest until bleeding resolves Will treat for UTI d/t moderate hgb on UA and no vaginal bleeding, Macrobid rx sent, urine culture today   [facility] 12/13/2012

## 2012-12-14 LAB — OBSTETRIC PANEL
Eosinophils Relative: 1 % (ref 0–5)
Hepatitis B Surface Ag: NEGATIVE
Lymphocytes Relative: 10 % — ABNORMAL LOW (ref 12–46)
Lymphs Abs: 1.1 10*3/uL (ref 0.7–4.0)
MCV: 82.1 fL (ref 78.0–100.0)
Neutro Abs: 8.4 10*3/uL — ABNORMAL HIGH (ref 1.7–7.7)
Neutrophils Relative %: 82 % — ABNORMAL HIGH (ref 43–77)
Platelets: 165 10*3/uL (ref 150–400)
RBC: 4.41 MIL/uL (ref 3.87–5.11)
Rubella: 3.04 Index — ABNORMAL HIGH (ref <0.90)
WBC: 10.4 10*3/uL (ref 4.0–10.5)

## 2013-01-10 ENCOUNTER — Other Ambulatory Visit: Payer: Self-pay | Admitting: Obstetrics and Gynecology

## 2013-01-10 ENCOUNTER — Ambulatory Visit (INDEPENDENT_AMBULATORY_CARE_PROVIDER_SITE_OTHER): Payer: Self-pay | Admitting: Obstetrics and Gynecology

## 2013-01-10 VITALS — BP 104/71 | Temp 98.2°F | Wt 126.4 lb

## 2013-01-10 DIAGNOSIS — Z3482 Encounter for supervision of other normal pregnancy, second trimester: Secondary | ICD-10-CM

## 2013-01-10 DIAGNOSIS — D069 Carcinoma in situ of cervix, unspecified: Secondary | ICD-10-CM

## 2013-01-10 DIAGNOSIS — Z3480 Encounter for supervision of other normal pregnancy, unspecified trimester: Secondary | ICD-10-CM

## 2013-01-10 LAB — POCT URINALYSIS DIP (DEVICE)
Protein, ur: NEGATIVE mg/dL
Specific Gravity, Urine: 1.02 (ref 1.005–1.030)
Urobilinogen, UA: 0.2 mg/dL (ref 0.0–1.0)

## 2013-01-10 NOTE — Patient Instructions (Signed)
Embarazo - Segundo trimestre (Pregnancy - Second Trimester) El segundo trimestre del Psychiatrist (del 3 al ) es un perodo de evolucin rpida para usted y el beb. Hacia el final del sexto mes, el beb mide aproximadamente 23 cm y pesa 680 g. Comenzar a Pharmacologist del beb [place] 18 y las 20 [address] de [place]. Podr sentir las pataditas ("quickening en ingls"). Hay un rpido [employer]. Puede segregar un lquido claro Charity fundraiser) de las [place]. Quizs sienta pequeas contracciones en el vientre (tero) Esto se conoce como falso trabajo de parto o contracciones de Braxton-H[place]. Es como una prctica del trabajo de parto que se produce cuando el beb est listo para salir. Generalmente los problemas de vmitos matinales ya se han superado hacia el final del Medical sales representative. Algunas mujeres desarrollan pequeas manchas oscuras (que se denominan cloasma, mscara del embarazo) en la cara que normalmente se van luego del nacimiento del beb. La exposicin al sol empeora las manchas. Puede desarrollarse acn en algunas mujeres embarazadas, y puede desaparecer en aquellas que ya tienen acn. EXAMENES PRENATALES  Durante los [employer], deber seguir realizando pruebas de [place], segn avance el [place]. Estas pruebas se realizan para controlar su salud y la del beb. Tambin se realizan anlisis de sangre para [employer] niveles de [place]. La anemia (bajo nivel de hemoglobina) es frecuente durante el embarazo. Para prevenirla, se administran hierro y vitaminas. Tambin se le realizarn exmenes para saber si tiene diabetes entre las 24 y las 28 semanas del [place]. Podrn repetirle algunas de las [employer] hicieron previamente.  En cada visita le medirn el tamao del tero. Esto se realiza para asegurarse de que el beb est creciendo correctamente de acuerdo al estado del [place].  Tambin en cada visita prenatal controlarn su presin arterial. Esto se realiza  para asegurarse de que no tenga toxemia.  Se controlar su orina para asegurarse de que no tenga infecciones, diabetes o protena en la orina.  Se controlar su peso regularmente para asegurarse que el aumento ocurre al ritmo indicado. Esto se hace para asegurarse que usted y el beb tienen una evolucin normal.  En algunas ocasiones se realiza una prueba de ultrasonido para confirmar el correcto desarrollo y evolucin del beb. Esta prueba se realiza con ondas sonoras inofensivas para el beb, de modo que el profesional pueda calcular ms precisamente la fecha del [place]. Algunas veces se realizan pruebas especializadas del lquido amnitico que rodea al beb. Esta prueba se denomina amniocentesis. El lquido amnitico se obtiene introduciendo una aguja en el vientre (abdomen). Se realiza para Conservator, museum/gallery en los que existe alguna preocupacin acerca de algn problema gentico que pueda sufrir el beb. En ocasiones se lleva a cabo cerca del final del embarazo, si es necesario inducir al [employer]. En este caso se realiza para asegurarse que los pulmones del beb estn lo suficientemente maduros como para que pueda vivir fuera del tero. CAMBIOS QUE OCURREN EN EL SEGUNDO TRIMESTRE DEL EMBARAZO Su organismo atravesar numerosos cambios durante el [employer]. Estos pueden variar de Neomia Dear persona a otra. [place] con el profesional que la asiste acerca los cambios que usted note y que la preocupen.  Durante el segundo trimestre probablemente sienta un aumento del apetito. Es normal tener "antojos" de Development worker, community. Esto vara de Neomia Dear persona a otra y de un embarazo a Therapist, art.  El abdomen inferior comenzar a abultarse.  Podr tener la necesidad de Geographical information systems officer con ms frecuencia debido a que  el tero y el beb presionan sobre la vejiga. Tambin es frecuente contraer ms infecciones urinarias durante el embarazo (dolor al [employer]). Puede evitarlas bebiendo gran cantidad de lquidos y vaciando  la vejiga antes y despus de Sales promotion account executive.  Podrn aparecer las primeras estras en las caderas, abdomen y [place]. Estos son cambios normales del cuerpo durante el [place]. No existen medicamentos ni ejercicios que puedan prevenir [employer].  Es posible que comience a desarrollar venas inflamadas y abultadas (varices) en las piernas. El uso de medias de descanso, Optometrist sus pies durante 15 minutos, 3 a 4 veces al da y Film/video editor la sal en su dieta ayuda a Journalist, newspaper.  Podr sentir Engineering geologist gstrica a medida que el tero crece y Doctor, general practice. Puede tomar anticidos, con la autorizacin de su mdico, para Financial planner. Tambin es til ingerir pequeas comidas 4 a 5 veces al Futures trader.  La constipacin puede tratarse con un laxante o agregando fibra a su dieta. Beber grandes cantidades de lquidos, comer vegetales, frutas y granos integrales es de [place].  Tambin es beneficioso practicar actividad fsica. Si ha sido una persona Engineer, mining, podr continuar con la [employer] de las actividades durante el mismo. Si ha sido [employer], puede ser beneficioso que comience con un programa de ejercicios, Museum/gallery exhibitions officer.  Puede desarrollar hemorroides (vrices en el recto) hacia el final del segundo trimestre. Tomar baos de asiento tibios y Chemical engineer cremas recomendadas por el profesional que lo asiste sern de ayuda para los problemas de hemorroides.  Tambin podr Financial risk analyst de espalda durante este momento de su embarazo. Evite levantar objetos pesados, utilice zapatos de taco bajo y [place] buena postura para ayudar a reducir los problemas de [place].  Algunas mujeres embarazadas desarrollan hormigueo y adormecimiento de la mano y los dedos debido a la hinchazn y compresin de los ligamentos de la mueca (sndrome del tnel carpiano). Esto desaparece una vez que el beb nace.  Como sus pechos se agrandan, Pension scheme manager un sujetador  ms grande. Use un sostn de soporte, cmodo y de algodn. No utilice un sostn para amamantar hasta el ltimo mes de embarazo si va a amamantar al beb.  Podr observar una lnea oscura desde el ombligo hacia la zona pbica denominada linea nigra.  Podr observar que sus mejillas se ponen coloradas debido al aumento de flujo sanguneo en la cara.  Podr desarrollar "araitas" en la cara, cuello y pecho. Esto desaparece una vez que el beb nace. INSTRUCCIONES PARA EL CUIDADO DOMICILIARIO  Es extremadamente importante que evite el cigarrillo, hierbas medicinales, alcohol y las drogas no prescriptas durante el Psychiatrist. Estas sustancias qumicas afectan la formacin y el desarrollo del beb. Evite estas sustancias durante todo el embarazo para asegurar el nacimiento de un beb sano.  La mayor parte de los cuidados que se aconsejan son los mismos que los indicados para Financial risk analyst trimestre del Psychiatrist. Cumpla con las citas tal como se le indic. Siga las instrucciones del profesional que lo asiste con respecto al uso de los medicamentos, el ejercicio y Psychologist, forensic.  Durante el embarazo debe obtener nutrientes para usted y para su beb. Consuma alimentos balanceados a intervalos regulares. Elija alimentos como carne, pescado, [place] y otros productos lcteos descremados, vegetales, frutas, panes integrales y cereales. El Equities trader cul es el aumento de peso ideal.  Las relaciones sexuales fsicas pueden continuarse hasta cerca del fin del embarazo si no existen otros problemas. Estos  problemas pueden ser la prdida temprana (prematura) de lquido amnitico de las [place], sangrado vaginal, dolor abdominal u otros problemas mdicos o del Psychiatrist.  Realice [employer], si no tiene restricciones. Consulte con el profesional que la asiste si no sabe con certeza si determinados ejercicios son seguros. El mayor aumento de peso tiene Environmental consultant durante los ltimos 2 trimestres del  Psychiatrist. El ejercicio la ayudar a:  Engineering geologist.  Ponerla en forma para el parto.  Ayudarla a perder peso luego de haber dado a luz.  Use un buen sostn o como los que se usan para hacer deportes para Paramedic la sensibilidad de las [place]. Tambin puede serle til si lo [place] mientras duerme. Si pierde Product manager, podr [employer].  No utilice la baera con agua caliente, baos turcos y saunas durante el [address]  Utilice el cinturn de seguridad sin excepcin cuando conduzca. Este la proteger a usted y al beb en caso de accidente.  Evite comer carne cruda, queso crudo, y el contacto con los utensilios y desperdicios de los gatos. Estos elementos contienen grmenes que pueden causar defectos de nacimiento en el beb.  El segundo trimestre es un buen momento para visitar a su dentista y Software engineer si an no lo ha hecho. Es Primary school teacher los dientes limpios. Utilice un cepillo de dientes blando. Cepllese ms suavemente durante el embarazo.  Es ms fcil perder algo de orina durante el [place]. Apretar y Chief Operating Officer los msculos de la pelvis la ayudar con este problema. Practique detener la miccin cuando est en el bao. Estos son los mismos msculos que Development worker, international aid. Son [employer] mismos msculos que utiliza cuando trata de [employer] gases. Puede practicar apretando estos msculos 10 veces, y repetir esto 3 veces por da aproximadamente. Una vez que conozca qu msculos debe apretar, no realice estos ejercicios durante la miccin. Puede favorecerle una infeccin si la orina vuelve hacia atrs.  Pida ayuda si tiene necesidades econmicas, de asesoramiento o nutricionales durante el [place]. El profesional podr ayudarla con respecto a estas necesidades, o derivarla a otros especialistas.  La piel puede ponerse grasa. Si esto sucede, lvese la cara con un jabn [place], utilice un humectante no graso y [place] con base de aceite o  crema. CONSUMO DE MEDICAMENTOS Y DROGAS DURANTE EL EMBARAZO  Contine tomando las vitaminas apropiadas para esta etapa tal como se le indic. Las vitaminas deben contener un miligramo de cido flico y deben suplementarse con hierro. Guarde todas las vitaminas fuera del alcance de los nios. La ingestin de slo un par de vitaminas o tabletas que contengan hierro puede ocasionar la [employer] en un beb o en un nio pequeo.  Evite el uso de [place], inclusive los de venta libre y hierbas que no hayan sido prescriptos o indicados por el profesional que la asiste. Algunos medicamentos pueden causar problemas fsicos al beb. Utilice los medicamentos de venta libre o de prescripcin para Chief Technology Officer, Environmental health practitioner o la [place], segn se lo indique el profesional que lo asiste. No utilice aspirina.  El consumo de alcohol est relacionado con ciertos defectos de nacimiento. Esto incluye el sndrome de alcoholismo fetal. Debe evitar el consumo de alcohol en cualquiera de sus formas. El cigarrillo causa nacimientos prematuros y bebs de [place]. El uso de drogas recreativas est absolutamente prohibido. Son muy nocivas para el beb. Un beb que nace de [employer], ser adicto al nacer. Ese beb tendr los mismos  sntomas de abstinencia que un adulto.  Infrmele al profesional si consume alguna droga.  No consuma drogas ilegales. Pueden causarle mucho dao al beb. SOLICITE ATENCIN MDICA SI: Tiene preguntas o preocupaciones durante su embarazo. Es mejor que llame para Science writer las dudas que esperar hasta su prxima visita prenatal. Thressa Sheller forma se sentir ms tranquila.  SOLICITE ATENCIN MDICA DE INMEDIATO SI:  La temperatura oral se eleva sin motivo por encima de 102 F (38.9 C) o segn le indique el profesional que lo asiste.  Tiene una prdida de lquido por la vagina (canal de parto). Si sospecha una ruptura de las [place], tmese la temperatura y llame al profesional para informarlo sobre  esto.  Observa unas pequeas manchas, una hemorragia vaginal o elimina cogulos. Notifique al profesional acerca de la cantidad y de cuntos apsitos est utilizando. Unas pequeas manchas de sangre son algo comn durante el Psychiatrist, especialmente despus de Sales promotion account executive.  Presenta un olor desagradable en la secrecin vaginal y observa un cambio en el color, de transparente a blanco.  Contina con las nuseas y no obtiene alivio de los remedios indicados. Vomita sangre o algo similar a la borra del caf.  [place] o sube ms de 900 g. en una semana, o segn lo indicado por el profesional que la asiste.  Observa que se le [employer], las manos, los pies o las piernas.  Ha estado expuesta a la rubola y no ha sufrido la enfermedad.  Ha estado expuesta a la quinta enfermedad o a la varicela.  Presenta dolor abdominal. Las molestias en el ligamento redondo son Neomia Dear causa no cancerosa (benigna) frecuente de dolor abdominal durante el embarazo. El profesional que la asiste deber evaluarla.  Presenta dolor de cabeza intenso que no se [place].  Presenta fiebre, diarrea, dolor al orinar o le falta la respiracin.  Presenta dificultad para ver, visin borrosa, o visin doble.  Sufre una cada, un accidente de trnsito o cualquier tipo de trauma.  Vive en un hogar en el que existe violencia fsica o mental. Document Released: 06/02/2005 Document Revised: 11/15/2011 [facility] Patient Information 2013 [place], [place].

## 2013-01-10 NOTE — Progress Notes (Signed)
Pulse: 73

## 2013-01-10 NOTE — Progress Notes (Signed)
No VB x weeks. Pap nl. Desires quad screen today.

## 2013-01-24 ENCOUNTER — Ambulatory Visit (HOSPITAL_COMMUNITY)
Admission: RE | Admit: 2013-01-24 | Discharge: 2013-01-24 | Disposition: A | Discharge status: Home / Self Care | Payer: Self-pay | Source: Ambulatory Visit | Attending: Obstetrics and Gynecology | Admitting: Obstetrics and Gynecology

## 2013-01-24 DIAGNOSIS — Z3689 Encounter for other specified antenatal screening: Secondary | ICD-10-CM | POA: Insufficient documentation

## 2013-01-24 DIAGNOSIS — D069 Carcinoma in situ of cervix, unspecified: Secondary | ICD-10-CM

## 2013-01-25 ENCOUNTER — Encounter: Payer: Self-pay | Admitting: *Deleted

## 2013-02-07 ENCOUNTER — Ambulatory Visit (INDEPENDENT_AMBULATORY_CARE_PROVIDER_SITE_OTHER): Payer: Self-pay | Admitting: Advanced Practice Midwife

## 2013-02-07 VITALS — BP 98/63 | Temp 97.2°F | Wt 134.0 lb

## 2013-02-07 DIAGNOSIS — Z3492 Encounter for supervision of normal pregnancy, unspecified, second trimester: Secondary | ICD-10-CM

## 2013-02-07 DIAGNOSIS — Z3480 Encounter for supervision of other normal pregnancy, unspecified trimester: Secondary | ICD-10-CM

## 2013-02-07 LAB — POCT URINALYSIS DIP (DEVICE)
Hgb urine dipstick: NEGATIVE
Nitrite: NEGATIVE
Specific Gravity, Urine: 1.025 (ref 1.005–1.030)
Urobilinogen, UA: 0.2 mg/dL (ref 0.0–1.0)
pH: 7 (ref 5.0–8.0)

## 2013-02-07 NOTE — Progress Notes (Signed)
LITHA LAMARTINA is 27 y.o. [redacted]w[redacted]d G2P1001 presents for her scheduled appointment. She has no complaints. Denies leaking fluid, bleeding or contractions. She has felt her baby move regularly. O: BP 98/63  Temp(Src) 97.2 F (36.2 C)  Wt 134 lb (60.782 kg)  BMI 23.74 kg/m2  LMP 09/20/2012 Abd: gravid, uterus at umbilicus  A/P:  - Plans to breast feed - preterm labor precautions given - Anatomy scan reviewed and normal - second trimester labs complete - O positive - FHR 135 - F/U: 4 weeks

## 2013-02-07 NOTE — Progress Notes (Signed)
Pulse  73 Edema trace in hands and feet.

## 2013-02-07 NOTE — Patient Instructions (Addendum)
Embarazo - Segundo trimestre (Pregnancy - Second Trimester) El segundo trimestre del embarazo (del 3 al 6 mes) es un perodo de evolucin rpida para usted y el beb. Hacia el final del sexto mes, el beb mide aproximadamente 23 cm y pesa 680 g. Comenzar a sentir los movimientos del beb entre las 18 y las 20 semanas de embarazo. Podr sentir las pataditas ("quickening en ingls"). Hay un rpido aumento de peso. Puede segregar un lquido claro (calostro) de las mamas. Quizs sienta pequeas contracciones en el vientre (tero) Esto se conoce como falso trabajo de parto o contracciones de Braxton-Hicks. Es como una prctica del trabajo de parto que se produce cuando el beb est listo para salir. Generalmente los problemas de vmitos matinales ya se han superado hacia el final del primer trimestre. Algunas mujeres desarrollan pequeas manchas oscuras (que se denominan cloasma, mscara del embarazo) en la cara que normalmente se van luego del nacimiento del beb. La exposicin al sol empeora las manchas. Puede desarrollarse acn en algunas mujeres embarazadas, y puede desaparecer en aquellas que ya tienen acn. EXAMENES PRENATALES  Durante los exmenes prenatales, deber seguir realizando pruebas de sangre, segn avance el embarazo. Estas pruebas se realizan para controlar su salud y la del beb. Tambin se realizan anlisis de sangre para conocer los niveles de hemoglobina. La anemia (bajo nivel de hemoglobina) es frecuente durante el embarazo. Para prevenirla, se administran hierro y vitaminas. Tambin se le realizarn exmenes para saber si tiene diabetes entre las 24 y las 28 semanas del embarazo. Podrn repetirle algunas de las pruebas que le hicieron previamente.  En cada visita le medirn el tamao del tero. Esto se realiza para asegurarse de que el beb est creciendo correctamente de acuerdo al estado del embarazo.  Tambin en cada visita prenatal controlarn su presin arterial. Esto se realiza  para asegurarse de que no tenga toxemia.  Se controlar su orina para asegurarse de que no tenga infecciones, diabetes o protena en la orina.  Se controlar su peso regularmente para asegurarse que el aumento ocurre al ritmo indicado. Esto se hace para asegurarse que usted y el beb tienen una evolucin normal.  En algunas ocasiones se realiza una prueba de ultrasonido para confirmar el correcto desarrollo y evolucin del beb. Esta prueba se realiza con ondas sonoras inofensivas para el beb, de modo que el profesional pueda calcular ms precisamente la fecha del parto. Algunas veces se realizan pruebas especializadas del lquido amnitico que rodea al beb. Esta prueba se denomina amniocentesis. El lquido amnitico se obtiene introduciendo una aguja en el vientre (abdomen). Se realiza para controlar los cromosomas en aquellos casos en los que existe alguna preocupacin acerca de algn problema gentico que pueda sufrir el beb. En ocasiones se lleva a cabo cerca del final del embarazo, si es necesario inducir al parto. En este caso se realiza para asegurarse que los pulmones del beb estn lo suficientemente maduros como para que pueda vivir fuera del tero. CAMBIOS QUE OCURREN EN EL SEGUNDO TRIMESTRE DEL EMBARAZO Su organismo atravesar numerosos cambios durante el embarazo. Estos pueden variar de una persona a otra. Converse con el profesional que la asiste acerca los cambios que usted note y que la preocupen.  Durante el segundo trimestre probablemente sienta un aumento del apetito. Es normal tener "antojos" de ciertas comidas. Esto vara de una persona a otra y de un embarazo a otro.  El abdomen inferior comenzar a abultarse.  Podr tener la necesidad de orinar con ms frecuencia debido a   que el tero y el beb presionan sobre la vejiga. Tambin es frecuente contraer ms infecciones urinarias durante el embarazo. Puede evitarlas bebiendo gran cantidad de lquidos y vaciando la vejiga antes y  despus de mantener relaciones sexuales.  Podrn aparecer las primeras estras en las caderas, abdomen y mamas. Estos son cambios normales del cuerpo durante el embarazo. No existen medicamentos ni ejercicios que puedan prevenir estos cambios.  Es posible que comience a desarrollar venas inflamadas y abultadas (varices) en las piernas. El uso de medias de descanso, elevar sus pies durante 15 minutos, 3 a 4 veces al da y limitar la sal en su dieta ayuda a aliviar el problema.  Podr sentir acidez gstrica a medida que el tero crece y presiona contra el estmago. Puede tomar anticidos, con la autorizacin de su mdico, para aliviar este problema. Tambin es til ingerir pequeas comidas 4 a 5 veces al da.  La constipacin puede tratarse con un laxante o agregando fibra a su dieta. Beber grandes cantidades de lquidos, comer vegetales, frutas y granos integrales es de gran ayuda.  Tambin es beneficioso practicar actividad fsica. Si ha sido una persona activa hasta el embarazo, podr continuar con la mayora de las actividades durante el mismo. Si ha sido menos activa, puede ser beneficioso que comience con un programa de ejercicios, como realizar caminatas.  Puede desarrollar hemorroides hacia el final del segundo trimestre. Tomar baos de asiento tibios y utilizar cremas recomendadas por el profesional que lo asiste sern de ayuda para los problemas de hemorroides.  Tambin podr sentir dolor de espalda durante este momento de su embarazo. Evite levantar objetos pesados, utilice zapatos de taco bajo y mantenga una buena postura para ayudar a reducir los problemas de espalda.  Algunas mujeres embarazadas desarrollan hormigueo y adormecimiento de la mano y los dedos debido a la hinchazn y compresin de los ligamentos de la mueca (sndrome del tnel carpiano). Esto desaparece una vez que el beb nace.  Como sus pechos se agrandan, necesitar un sujetador ms grande. Use un sostn de soporte,  cmodo y de algodn. No utilice un sostn para amamantar hasta el ltimo mes de embarazo si va a amamantar al beb.  Podr observar una lnea oscura desde el ombligo hacia la zona pbica denominada linea nigra.  Podr observar que sus mejillas se ponen coloradas debido al aumento de flujo sanguneo en la cara.  Podr desarrollar "araitas" en la cara, cuello y pecho. Esto desaparece una vez que el beb nace. INSTRUCCIONES PARA EL CUIDADO DOMICILIARIO  Es extremadamente importante que evite el cigarrillo, hierbas medicinales, alcohol y las drogas no prescriptas durante el embarazo. Estas sustancias qumicas afectan la formacin y el desarrollo del beb. Evite estas sustancias durante todo el embarazo para asegurar el nacimiento de un beb sano.  La mayor parte de los cuidados que se aconsejan son los mismos que los indicados para el primer trimestre del embarazo. Cumpla con las citas tal como se le indic. Siga las instrucciones del profesional que lo asiste con respecto al uso de los medicamentos, el ejercicio y la dieta.  Durante el embarazo debe obtener nutrientes para usted y para su beb. Consuma alimentos balanceados a intervalos regulares. Elija alimentos como carne, pescado, leche y otros productos lcteos descremados, vegetales, frutas, panes integrales y cereales. El profesional le informar cul es el aumento de peso ideal.  Las relaciones sexuales fsicas pueden continuarse hasta cerca del fin del embarazo si no existen otros problemas. Estos problemas pueden ser la prdida temprana (  prematura) de lquido amnitico de las membranas, sangrado vaginal, dolor abdominal u otros problemas mdicos o del embarazo.  Realice actividad fsica todos los das, si no tiene restricciones. Consulte con el profesional que la asiste si no sabe con certeza si determinados ejercicios son seguros. El mayor aumento de peso tiene lugar durante los ltimos 2 trimestres del embarazo. El ejercicio la ayudar  a:  Controlar su peso.  Ponerla en forma para el parto.  Ayudarla a perder peso luego de haber dado a luz.  Use un buen sostn o como los que se usan para hacer deportes para aliviar la sensibilidad de las mamas. Tambin puede serle til si lo usa mientras duerme. Si pierde calostro, podr utilizar apsitos en el sostn.  No utilice la baera con agua caliente, baos turcos y saunas durante el embarazo.  Utilice el cinturn de seguridad sin excepcin cuando conduzca. Este la proteger a usted y al beb en caso de accidente.  Evite comer carne cruda, queso crudo, y el contacto con los utensilios y desperdicios de los gatos. Estos elementos contienen grmenes que pueden causar defectos de nacimiento en el beb.  El segundo trimestre es un buen momento para visitar a su dentista y evaluar su salud dental si an no lo ha hecho. Es importante mantener los dientes limpios. Utilice un cepillo de dientes blando. Cepllese ms suavemente durante el embarazo.  Es ms fcil perder algo de orina durante el embarazo. Apretar y fortalecer los msculos de la pelvis la ayudar con este problema. Practique detener la miccin cuando est en el bao. Estos son los mismos msculos que necesita fortalecer. Son tambin los mismos msculos que utiliza cuando trata de evitar los gases. Puede practicar apretando estos msculos 10 veces, y repetir esto 3 veces por da aproximadamente. Una vez que conozca qu msculos debe apretar, no realice estos ejercicios durante la miccin. Puede favorecerle una infeccin si la orina vuelve hacia atrs.  Pida ayuda si tiene necesidades econmicas, de asesoramiento o nutricionales durante el embarazo. El profesional podr ayudarla con respecto a estas necesidades, o derivarla a otros especialistas.  La piel puede ponerse grasa. Si esto sucede, lvese la cara con un jabn suave, utilice un humectante no graso y maquillaje con base de aceite o crema. CONSUMO DE MEDICAMENTOS Y DROGAS  DURANTE EL EMBARAZO  Contine tomando las vitaminas apropiadas para esta etapa tal como se le indic. Las vitaminas deben contener un miligramo de cido flico y deben suplementarse con hierro. Guarde todas las vitaminas fuera del alcance de los nios. La ingestin de slo un par de vitaminas o tabletas que contengan hierro puede ocasionar la muerte en un beb o en un nio pequeo.  Evite el uso de medicamentos, inclusive los de venta libre y hierbas que no hayan sido prescriptos o indicados por el profesional que la asiste. Algunos medicamentos pueden causar problemas fsicos al beb. Utilice los medicamentos de venta libre o de prescripcin para el dolor, el malestar o la fiebre, segn se lo indique el profesional que lo asiste. No utilice aspirina.  El consumo de alcohol est relacionado con ciertos defectos de nacimiento. Esto incluye el sndrome de alcoholismo fetal. Debe evitar el consumo de alcohol en cualquiera de sus formas. El cigarrillo causa nacimientos prematuros y bebs de bajo peso. El uso de drogas recreativas est absolutamente prohibido. Son muy nocivas para el beb. Un beb que nace de una madre adicta, ser adicto al nacer. Ese beb tendr los mismos sntomas de abstinencia que un adulto.    Infrmele al profesional si consume alguna droga.  No consuma drogas ilegales. Pueden causarle mucho dao al beb. SOLICITE ATENCIN MDICA SI: Tiene preguntas o preocupaciones durante su embarazo. Es mejor que llame para consultar las dudas que esperar hasta su prxima visita prenatal. De esta forma se sentir ms tranquila.  SOLICITE ATENCIN MDICA DE INMEDIATO SI:  La temperatura oral se eleva sin motivo por encima de 102 F (38.9 C) o segn le indique el profesional que lo asiste.  Tiene una prdida de lquido por la vagina (canal de parto). Si sospecha una ruptura de las membranas, tmese la temperatura y llame al profesional para informarlo sobre esto.  Observa unas pequeas manchas,  una hemorragia vaginal o elimina cogulos. Notifique al profesional acerca de la cantidad y de cuntos apsitos est utilizando. Unas pequeas manchas de sangre son algo comn durante el embarazo, especialmente despus de mantener relaciones sexuales.  Presenta un olor desagradable en la secrecin vaginal y observa un cambio en el color, de transparente a blanco.  Contina con las nuseas y no obtiene alivio de los remedios indicados. Vomita sangre o algo similar a la borra del caf.  [place] o sube ms de 900 g. en una semana, o segn lo indicado por el profesional que la asiste.  Observa que se le hinchan el rostro, las manos, los pies o las piernas.  Ha estado expuesta a la rubola y no ha sufrido la enfermedad.  Ha estado expuesta a la quinta enfermedad o a la varicela.  Presenta dolor abdominal. Las molestias en el ligamento redondo son una causa no cancerosa (benigna) frecuente de dolor abdominal durante el embarazo. El profesional que la asiste deber evaluarla.  Presenta dolor de cabeza intenso que no se alivia.  Presenta fiebre, diarrea, dolor al orinar o le falta la respiracin.  Presenta dificultad para ver, visin borrosa, o visin doble.  Sufre una cada, un accidente de trnsito o cualquier tipo de trauma.  Vive en un hogar en el que existe violencia fsica o mental. Document Released: 06/02/2005 Document Revised: 05/17/2012 [employer] Patient Information 2014 [employer].  

## 2013-02-07 NOTE — Progress Notes (Signed)
I have seen the patient with the resident/student and agree with the above.  Leslie Villarreal  

## 2013-03-07 ENCOUNTER — Ambulatory Visit (INDEPENDENT_AMBULATORY_CARE_PROVIDER_SITE_OTHER): Payer: Self-pay | Admitting: Advanced Practice Midwife

## 2013-03-07 VITALS — BP 98/58 | Wt 142.5 lb

## 2013-03-07 DIAGNOSIS — Z3480 Encounter for supervision of other normal pregnancy, unspecified trimester: Secondary | ICD-10-CM

## 2013-03-07 DIAGNOSIS — Z3482 Encounter for supervision of other normal pregnancy, second trimester: Secondary | ICD-10-CM

## 2013-03-07 LAB — POCT URINALYSIS DIP (DEVICE)
Hgb urine dipstick: NEGATIVE
Ketones, ur: NEGATIVE mg/dL
Nitrite: NEGATIVE
Protein, ur: NEGATIVE mg/dL
pH: 7 (ref 5.0–8.0)

## 2013-03-07 NOTE — Progress Notes (Signed)
No complaints today. Feeling well.

## 2013-03-07 NOTE — Progress Notes (Signed)
Pulse: 74

## 2013-04-04 ENCOUNTER — Encounter: Payer: Self-pay | Admitting: Obstetrics and Gynecology

## 2013-04-11 ENCOUNTER — Ambulatory Visit (INDEPENDENT_AMBULATORY_CARE_PROVIDER_SITE_OTHER): Payer: Self-pay | Admitting: Advanced Practice Midwife

## 2013-04-11 VITALS — BP 98/62 | Temp 98.0°F | Wt 149.3 lb

## 2013-04-11 DIAGNOSIS — Z3490 Encounter for supervision of normal pregnancy, unspecified, unspecified trimester: Secondary | ICD-10-CM

## 2013-04-11 DIAGNOSIS — Z3480 Encounter for supervision of other normal pregnancy, unspecified trimester: Secondary | ICD-10-CM

## 2013-04-11 LAB — POCT URINALYSIS DIP (DEVICE)
Protein, ur: NEGATIVE mg/dL
Specific Gravity, Urine: 1.015 (ref 1.005–1.030)
Urobilinogen, UA: 0.2 mg/dL (ref 0.0–1.0)
pH: 7 (ref 5.0–8.0)

## 2013-04-11 NOTE — Progress Notes (Signed)
Doing well.  Good fetal movement, denies vaginal bleeding, LOF, regular contractions.  

## 2013-04-11 NOTE — Progress Notes (Signed)
Pulse: 81

## 2013-04-12 LAB — GLUCOSE TOLERANCE, 1 HOUR (50G) W/O FASTING: Glucose, 1 Hour GTT: 121 mg/dL (ref 70–140)

## 2013-04-12 LAB — RPR

## 2013-04-12 LAB — CBC
HCT: 36.2 % (ref 36.0–46.0)
Hemoglobin: 11.7 g/dL — ABNORMAL LOW (ref 12.0–15.0)
MCH: 27.2 pg (ref 26.0–34.0)
MCHC: 32.3 g/dL (ref 30.0–36.0)
RDW: 13.8 % (ref 11.5–15.5)

## 2013-04-25 ENCOUNTER — Ambulatory Visit (INDEPENDENT_AMBULATORY_CARE_PROVIDER_SITE_OTHER): Payer: Self-pay | Admitting: Advanced Practice Midwife

## 2013-04-25 VITALS — BP 108/67 | Wt 152.7 lb

## 2013-04-25 DIAGNOSIS — Z3483 Encounter for supervision of other normal pregnancy, third trimester: Secondary | ICD-10-CM

## 2013-04-25 DIAGNOSIS — Z3480 Encounter for supervision of other normal pregnancy, unspecified trimester: Secondary | ICD-10-CM

## 2013-04-25 LAB — POCT URINALYSIS DIP (DEVICE)
Ketones, ur: NEGATIVE mg/dL
Protein, ur: NEGATIVE mg/dL
Specific Gravity, Urine: >=1.03 (ref 1.005–1.030)
Urobilinogen, UA: 0.2 mg/dL (ref 0.0–1.0)
pH: 6 (ref 5.0–8.0)

## 2013-04-25 NOTE — Progress Notes (Signed)
P = 87 

## 2013-04-25 NOTE — Patient Instructions (Signed)
Elección del método anticonceptivo   (Contraception Choices)  La anticoncepción (control de la natalidad) es el uso de cualquier método o dispositivo para evitar el embarazo. A continuación se indican algunos de esos métodos.   MÉTODOS HORMONALES   · Implante anticonceptivo. Es un tubo plástico delgado que contiene la hormona progesterona. No contiene estrógenos. El médico inserta el tubo en la parte interna del brazo. El tubo puede permanecer en el lugar durante 3 años. Después de los 3 años debe retirarse. El implante impide que los ovarios liberen óvulos (ovulación), espesa el moco cervical, lo que evita que los espermatozoides ingresen al útero y hace más delgada la membrana que cubre el interior del útero.  · Inyecciones de progesterona sola. Estas inyecciones se administran cada 3 meses para evitar el embarazo. La progesterona sintética impide que los ovarios liberen óvulos. También hace que el moco cervical se espese y modifica el recubrimiento interno del útero. Esto hace más difícil que los espermatozoides sobrevivan en el útero.  · Píldoras anticonceptivas. Las píldoras anticonceptivas contienen estrógenos y progesterona. Actúan impidiendo que el óvulo se forme en el ovario(ovulación). Las píldoras anticonceptivas son recetadas por el médico. También se utilizan para tratar los períodos menstruales abundantes.  · Minipíldora. Este tipo de píldora anticonceptiva contiene sólo hormona progesterona. Deben tomarse todos los días del mes y debe recetarlas el médico.  · Parches anticonceptivos. El parche contiene hormonas similares a las que contienen las píldoras anticonceptivas. Deben cambiarse una vez por semana y se utilizan bajo prescripción médica.  · Anillo vaginal. Anillo vaginal contiene hormonas similares a las que contienen las píldoras anticonceptivas. Se deja colocado durante tres semanas, se lo retira durante 1 semana y luego se coloca uno nuevo. La paciente debe sentirse cómoda para insertar y  retirar el anillo de la vagina. Es necesaria la receta del médico.  · Anticonceptivos de emergencia. Los anticonceptivos de emergencia son métodos para evitar un embarazo después de una relación sexual sin protección. Esta píldora puede tomarse inmediatamente después de tener relaciones sexuales o hasta 5 días de haber tenido sexo sin protección. Es más efectiva si se toma poco tiempo después. Los anticonceptivos de emergencia están disponibles sin prescripción médica. Consúltelo con su farmacéutico. No use los anticonceptivos de emergencia como único método anticonceptivo.  MÉTODOS DE BARRERA   · Condón masculino. Es una vaina delgada (látex o goma) que se usa en el pene durante el acto sexual. Puede usarse con espermicida para aumentar la efectividad.  · Condón femenino. Es una vaina blanda y floja que se adapta suavemente a la vagina antes de las relaciones sexuales.  · Diafragma. Es una barrera de látex redonda y suave que debe ser ajustada por un profesional. Se inserta en la vagina, junto con un gel espermicida. Debe insertarse antes de tener relaciones sexuales. Debe dejar el diafragma colocado en la vagina durante 6 a 8 horas después de la relación sexual.  · Capuchón cervical. Es una taza de látex o plástico, redonda y suave que cubre el cuello del útero y debe ser ajustada por un médico. Puede dejarlo colocado en la vagina hasta 48 horas después de las relaciones sexuales.  · Esponja. Es una pieza blanda y circular de espuma de poliuretano. Contiene un espermicida. Se inserta en la vagina después de mojarla y antes de las relaciones sexuales.  · Espermicidas. Los espermicidas son químicos que matan o bloquean el esperma y no lo dejan ingresar al cuello del útero y al útero. Vienen en forma de cremas, geles, supositorios,   espuma o comprimidos. No es necesario tener receta médica. Se insertan en la vagina con un aplicador antes de tener relaciones sexuales. El proceso debe repetirse cada vez que tiene  relaciones sexuales.  ANTICONCEPTIVOS INTRAUTERINOS   · Dispositivo intrauterino (DIU). Es un dispositivo en forma de T que se coloca en el útero durante el período menstrual, para evitar el embarazo. Hay dos tipos:  · DIU de cobre. Este tipo de DIU está recubierto con un alambre de cobre y se inserta dentro del útero. El cobre hace que el útero y las trompas de Falopio produzcan un liquido que destruye los espermatozoides. Puede permanecer colocado durante 10 años.  · DIU hormonal. Este tipo de DIU contiene la hormona progestina (progesterona sintética). La hormona espesa el moco cervical y evita que los espermatozoides ingresen al útero y también afina la membrana que cubre el útero para evitar la implantación del óvulo fertilizado. La hormona debilita o destruye los espermatozoides que ingresan al útero. Puede permanecer colocado durante 5 años.  MÉTODOS ANTICONCEPTIVOS PERMANENTES   · Ligadura de trompas en la mujer. La ligadura de trompas en la mujer se realiza sellando, atando u obstruyendo quirúrgicamente las trompas de Falopio lo que impide que el óvulo descienda hacia el útero.  · Esterilización masculina. Se realiza atando los conductos por los que pasan los espermatozoides (vasectomía). Esto impide que el esperma ingrese a la vagina durante el acto sexual. Luego del procedimiento, el hombre puede eyacular líquido (semen).  MÉTODOS DE PLANIFICACIÓN NATURAL   · Planificación familiar natural.  Consiste en no tener relaciones sexuales o usar un método de barrera (condón, diafragma, capuchón cervical) en los días que la mujer podría quedar embarazada.  · Método calendario.  Consiste en el seguimiento de la duración de cada ciclo menstrual y la identificación de los períodos fértiles.  · Método de la ovulación.  Consiste en evitar las relaciones sexuales durante la ovulación.  · Método sintotérmico. Consiste en evitar las relaciones sexuales en la época en la que se está ovulando, utilizando un termómetro y  tendiendo en cuenta los síntomas de la ovulación.  · Método post-ovulación. Consiste en planificar las relaciones sexuales para después de haber ovulado.  Independientemente del tipo o método anticonceptivo que usted elija, es importante que use condones para protegerse contra las enfermedades de transmisión sexual (ETS). Hable con su médico con respecto a qué método anticonceptivo es el más apropiado para usted.   Document Released: 08/23/2005 Document Revised: 11/15/2011  [employer]® Patient Information ©2014 [employer].

## 2013-04-25 NOTE — Progress Notes (Signed)
No complaints, feeling well. Contraception options discussed today, and handout given.

## 2013-05-09 ENCOUNTER — Ambulatory Visit (INDEPENDENT_AMBULATORY_CARE_PROVIDER_SITE_OTHER): Payer: Self-pay | Admitting: Family Medicine

## 2013-05-09 ENCOUNTER — Encounter: Payer: Self-pay | Admitting: Family Medicine

## 2013-05-09 VITALS — BP 114/71 | Temp 97.4°F | Wt 155.6 lb

## 2013-05-09 DIAGNOSIS — Z3480 Encounter for supervision of other normal pregnancy, unspecified trimester: Secondary | ICD-10-CM

## 2013-05-09 DIAGNOSIS — Z23 Encounter for immunization: Secondary | ICD-10-CM

## 2013-05-09 DIAGNOSIS — Z3483 Encounter for supervision of other normal pregnancy, third trimester: Secondary | ICD-10-CM

## 2013-05-09 LAB — POCT URINALYSIS DIP (DEVICE)
Glucose, UA: NEGATIVE mg/dL
Hgb urine dipstick: NEGATIVE
Nitrite: NEGATIVE
Protein, ur: NEGATIVE mg/dL
Specific Gravity, Urine: 1.02 (ref 1.005–1.030)
Urobilinogen, UA: 0.2 mg/dL (ref 0.0–1.0)
pH: 7 (ref 5.0–8.0)

## 2013-05-09 MED ORDER — TETANUS-DIPHTH-ACELL PERTUSSIS 5-2.5-18.5 LF-MCG/0.5 IM SUSP
0.5000 mL | Freq: Once | INTRAMUSCULAR | Status: AC
  Administered 2013-05-09: 0.5 mL via INTRAMUSCULAR

## 2013-05-09 NOTE — Patient Instructions (Signed)
Embarazo  Systems analyst trimestre  (Pregnancy - Third Trimester) El tercer trimestre del Psychiatrist (los ltimos 3 meses) es el perodo en el cual tanto usted como su beb crecen con ms rapidez. El beb alcanza un largo de aproximadamente 50 cm. y pesa entre 2,700 y 4,500 kg. El beb gana ms tejido graso y est listo para la vida fuera del cuerpo de la [place]. Mientras estn en el interior, los bebs tienen perodos de sueo y vigilia, Warehouse manager y tienen hipo. Quizs sienta pequeas contracciones del tero. Este es el falso trabajo de [place]. Tambin se las conoce como contracciones de [place] . Es como una prctica del parto. Los problemas ms habituales de esta etapa del embarazo incluyen mayor dificultad para respirar, hinchazn de las manos y los pies por retencin de lquidos y la necesidad de Geographical information systems officer con ms frecuencia debido a que el tero y el beb presionan sobre la vejiga.  EXAMENES PRENATALES   Durante los [employer], deber seguir realizndose anlisis de [place]. Estas pruebas se realizan para controlar su salud y la del beb. Los [employer] de sangre se Radiographer, therapeutic para [employer] niveles de algunos compuestos de la sangre (hemoglobina). La anemia (bajo nivel de hemoglobina) es frecuente durante el embarazo. Para prevenirla, se administran hierro y vitaminas. Tambin le tomarn nuevas anlisis para descartar diabetes. Podrn repetirle algunas de las [employer] hicieron previamente.  En cada visita le medirn el tamao del tero. Esto permite asegurar que el beb se desarrolla adecuadamente, segn la fecha del embarazo.  Le controlarn la presin arterial en cada visita prenatal. Esto es para asegurarse de que no sufre toxemia.  Le harn un anlisis de orina en cada visita prenatal, para descartar infecciones, diabetes y la presencia de protenas.  Tambin en cada visita controlarn su peso. Esto se realiza para asegurarse que aumenta de peso al ritmo indicado y que usted y  su beb evolucionan normalmente.  En algunas ocasiones se realiza una prueba de ultrasonido para confirmar el correcto desarrollo y evolucin del beb. Esta prueba se realiza con ondas sonoras inofensivas para el beb, de modo que el profesional pueda calcular ms precisamente la fecha del [place].  Analice con su mdico los analgsicos y la anestesia que recibir durante el [place] de parto y [place].  Comente la posibilidad de que necesite una cesrea y qu anestesia se recibir.  Informe a su mdico si sufre violencia familiar mental o fsica. A veces, se indica la prueba especializada sin estrs, la prueba de tolerancia a las contracciones y el perfil biofsico para asegurarse de que el beb no tiene problemas. El estudio del lquido amnitico que rodea al beb se llama amniocentesis. El lquido amnitico se obtiene introduciendo una aguja en el vientre (abdomen ). En ocasiones se lleva a cabo cerca del final del embarazo, si es necesario inducir a un parto. En este caso se realiza para asegurarse que los pulmones del beb estn lo suficientemente maduros como para que pueda vivir fuera del tero. Si los pulmones no han madurado y es peligroso que el beb nazca, se Building services engineer a la madre una inyeccin de [place] , 1 a 2 [address]  [address] antes del [address]. Vivia Budge ayuda a que los pulmones del beb maduren y sea ms seguro su nacimiento.  CAMBIOS QUE OCURREN EN EL TERCER TRIMESTRE DEL EMBARAZO  Su organismo atravesar numerosos cambios durante el [place]. Estos pueden variar de Neomia Dear persona a otra. Converse con el profesional que la asiste acerca los cambios que  usted note y que la preocupen.   Durante el ltimo trimestre probablemente sienta un aumento del apetito. Es normal tener "antojos" de Development worker, community. Esto vara de Neomia Dear persona a otra y de un embarazo a Therapist, art.  Podrn aparecer las primeras estras en las caderas, abdomen y [place]. Estos son cambios normales del cuerpo durante el [place]. No existen  medicamentos ni ejercicios que puedan prevenir [employer].  La constipacin puede tratarse con un laxante o agregando fibra a su dieta. Beber grandes cantidades de lquidos, tomar fibras en forma de vegetales, frutas y granos integrales es de gran [place].  Tambin es beneficioso practicar actividad fsica. Si ha sido una persona Engineer, mining, podr continuar con la [employer] de las actividades durante el mismo. Si ha sido [employer], puede ser beneficioso que comience con un programa de ejercicios, Museum/gallery exhibitions officer. Consulte con el profesional que la asiste antes de comenzar un programa de ejercicios.  Evite el consumo de cigarrillos, el alcohol, los medicamentos no recetados y las "drogas de la calle" durante el Psychiatrist. Estas sustancias qumicas afectan la formacin y el desarrollo del beb. Evite estas sustancias durante todo el embarazo para asegurar el nacimiento de un beb sano.  Podr sentir dolor de espalda, tener vrices en las venas y hemorroides, o si ya los sufra, pueden [place].  Durante el tercer trimestre se cansar con ms facilidad, lo cual es normal.  Los movimientos del beb pueden ser ms fuertes y con ms frecuencia.  Puede que note dificultades para respirar normalmente.  El ombligo puede salir hacia afuera.  A veces sale Veterinary surgeon de las [place], que se llama Product manager.  Podr aparecer Neomia Dear secrecin mucosa con sangre. Esto suele ocurrir [employer] unos [address] y Neomia Dear semana antes del [place]. INSTRUCCIONES PARA EL CUIDADO EN EL HOGAR   Cumpla con las citas de control. Siga las indicaciones del mdico con respecto al uso de [place], los ejercicios y la dieta.  Durante el embarazo debe obtener nutrientes para usted y para su beb. Consuma alimentos balanceados a intervalos regulares. Elija alimentos como carne, pescado, [place] y otros productos lcteos descremados, vegetales, frutas, panes integrales y cereales. El [employer] informar  cul es el aumento de peso ideal.  Las relaciones sexuales pueden continuarse hasta casi el final del embarazo, si no se presentan otros problemas como prdida prematura (antes de [place]) de lquido amnitico, hemorragia vaginal o dolor en el vientre (abdominal).  Realice [employer], si no tiene restricciones. Consulte con el profesional que la asiste si no sabe con certeza si determinados ejercicios son seguros. El mayor aumento de peso se producir en los ltimos 2 trimestres del Psychiatrist. El ejercicio ayuda a:  Engineering geologist.  Mantenerse en forma para el trabajo de parto y [place] .  Perder peso despus del parto.  Haga reposo con frecuencia, con las piernas elevadas, o segn lo necesite para evitar los calambres y el dolor de cintura.  Use un buen sostn o como los que se usan para hacer deportes para Paramedic la sensibilidad de las [place]. Tambin puede serle til si lo [place] mientras duerme. Si pierde Product manager, podr [employer].  No utilice la baera con agua caliente, baos turcos y saunas.   Colquese el cinturn de seguridad cuando conduzca. Este la proteger a usted y al beb en caso de accidente.  Evite comer carne cruda y el contacto con los utensilios y desperdicios de los gatos. Estos  elementos contienen grmenes que pueden causar defectos de nacimiento en el beb.  Es fcil perder algo de orina durante el [place]. Apretar y Chief Operating Officer los msculos de la pelvis la ayudar con este problema. Practique detener la miccin cuando est en el bao. Estos son los mismos msculos que Development worker, international aid. Son [employer] mismos msculos que utiliza cuando trata de evitar despedir gases. Puede practicar apretando estos msculos [employer], y repetir esto tres veces por da aproximadamente. Una vez que conozca qu msculos debe apretar, no realice estos ejercicios durante la miccin. Puede favorecerle una infeccin si la orina vuelve hacia  atrs.  Pida ayuda si tienen necesidades financieras, teraputicas o nutricionales. El profesional podr ayudarla con respecto a estas necesidades, o derivarla a otros especialistas.  Haga una lista de nmeros telefnicos de emergencia y tngalos disponibles.  Planifique como obtener ayuda de familiares o amigos cuando regrese a Programmer, applications hospital.  Hacer un ensayo sobre la partida al hospital.  Ecru clases prenatales con el padre para entender, practicar y hacer preguntas sobre el [place] de parto y el alumbramiento.  Preparar la habitacin del beb / busque Fatima Blank.  No viaje fuera de la ciudad a menos que sea absolutamente necesario y con el asesoramiento de su mdico.  Use slo zapatos de tacn bajo o sin tacn para tener mejor equilibrio y Automotive engineer cadas. USO DE MEDICAMENTOS Y CONSUMO DE DROGAS DURANTE EL [facility]   Tome las vitaminas apropiadas para esta etapa tal como se le indic. Las vitaminas deben contener un miligramo de cido flico. Guarde todas las vitaminas fuera del alcance de los nios. La ingestin de slo un par de vitaminas o tabletas que contengan hierro pueden ocasionar la [employer] en un beb o en un nio pequeo.  Evite el uso de [employer] Omaha, incluyendo hierbas, medicamentos de [place], sin receta o que no hayan sido sugeridos por su mdico. Slo tome medicamentos de venta libre o medicamentos recetados para Chief Technology Officer, Environmental health practitioner o fiebre como lo indique su mdico. No tome aspirina, ibuprofeno o naproxeno excepto que su mdico se lo indique.  Infrmele al profesional si consume alguna droga.  El alcohol se relaciona con ciertos defectos congnitos. Incluye el sndrome de alcoholismo fetal. Debe evitar absolutamente el consumo de alcohol, en cualquier forma. El fumar produce baja tasa de natalidad y bebs prematuros.  Las drogas ilegales o de la calle son muy perjudiciales para el beb. Estn absolutamente prohibidas. Un beb que nace de [employer], ser adicto al nacer. Ese beb tendr los mismos sntomas de abstinencia que un adulto. SOLICITE ATENCIN MDICA SI:  Tiene preguntas o preocupaciones relacionadas con el embarazo. Es mejor que llame para formular las preguntas si no puede esperar hasta la prxima visita, que sentirse preocupada por ellas.  SOLICITE ATENCIN MDICA DE INMEDIATO SI:   La temperatura oral le sube a ms de 38,9 C (102 F) o lo que su mdico le indique.  Tiene una prdida de lquido por la vagina (canal de parto). Si sospecha una ruptura de las [place], tmese la temperatura y llame al profesional para informarlo sobre esto.  Observa unas pequeas manchas, una hemorragia vaginal o elimina cogulos. Notifique al profesional acerca de la cantidad y de cuntos apsitos est utilizando.  Presenta un olor desagradable en la secrecin vaginal y observa un cambio en el color, de transparente a blanco.  Ha vomitado durante ms de 24 horas.  Siente escalofros o le sube la fiebre.  Conley Rolls  falta el aire.  Siente ardor al Beatrix Shipper.  Baja o sube ms de 2 libras (900 g), o segn lo indicado por el profesional que la asiste.  Observa que sbitamente se le hinchan el rostro, las manos, los pies o las piernas.  Siente dolor en el vientre (abdominal). Las [employer] en el ligamento redondo son Neomia Dear causa benigna frecuente de dolor abdominal durante el embarazo. El profesional que la asiste deber evaluarla.  Presenta dolor de cabeza intenso que no se [place].  Tiene problemas visuales, visin doble o borrosa.  Si no siente los movimientos del beb durante ms de 1 hora. Si piensa que el beb no se mueve tanto como lo haca habitualmente, coma algo que Psychologist, clinical y [employer] lado izquierdo durante [place]. El beb debe moverse al menos 4  5 veces por hora. Comunquese inmediatamente si el beb se mueve menos que lo indicado.  Se cae, se ve involucrada en un accidente automovilstico o sufre algn  tipo de traumatismo.  En su hogar hay violencia mental o fsica. Document Released: 06/02/2005 Document Revised: 05/17/2012 [facility] Patient Information 2014 [place], [place]. Place 32-42 weeks prenatal visit patient instructions here.

## 2013-05-09 NOTE — [place] Notes (Signed)
  Subjective:    MILEAH HEMMER is a 27 y.o. female being seen today for her obstetrical visit. She is at [redacted]w[redacted]d gestation. Patient reports no bleeding, no contractions, no cramping and no leaking. Fetal movement: normal.  Menstrual History: OB History   Grav Para Term Preterm Abortions TAB SAB Ect Mult Living   2 1 1  0 0 0 0 0 0 1       Patient's last menstrual period was 09/20/2012.    The following portions of the patient's history were reviewed and updated as appropriate: allergies, current medications, past family history, past medical history, past social history, past surgical history and problem list.  Review of Systems Pertinent items are noted in HPI.   Objective:    BP 114/71  Temp(Src) 97.4 F (36.3 C)  Wt 70.58 kg (155 lb 9.6 oz)  BMI 27.57 kg/m2  LMP 09/20/2012 FHT:  140 BPM  Uterine Size: 31 cm and size equals dates  Presentation:      Assessment:  JADEN BATCHELDER is a 27 y.o. G2P1001 at [redacted]w[redacted]d here for ROB    Plan:   Discussed with Patient:  -Plans to  breast feed.  All questions answered. -Continue prenatal vitamins. -Reviewed fetal kick counts Pt to perform daily at a time when the baby is active, lie laterally with both hands on belly in quiet room and count all movements (hiccups, shoulder rolls, obvious kicks, etc); pt is to report to clinic L&D for less than 10 movements felt in a one hour time period-pt told as soon as she counts 10 movements the count is complete.  - Routine precautions discussed (depression, infection s/s).   Patient provided with all pertinent phone numbers for emergencies. - RTC for any VB, regular, painful cramps/ctxs occurring at a rate of >2/10 min, fever (100.5 or higher), n/v/d, any pain that is unresolving or worsening, LOF, decreased fetal movement, CP, SOB, edema - RTC in 2 weeks for next appt.  Problems: Patient Active Problem List   Diagnosis Date Noted  . Supervision of normal intrauterine pregnancy in  multigravida 12/13/2012  . CIN III (cervical intraepithelial neoplasia grade III) with severe dysplasia 05/04/2011    To Do: 1. Tdap will be given today  [ ]  Vaccines: Flu:  Tdap:  [ ]  BCM: undecided [ ]  Readiness: baby has a place to sleep, car seat, other baby necessities.  Edu: [ ]  PTL precautions; [ ]  BF class; [ ]  childbirth class; [ ]   BF counseling;

## 2013-05-09 NOTE — Progress Notes (Signed)
P=95, Used Interpreter 

## 2013-05-23 ENCOUNTER — Encounter: Payer: Self-pay | Admitting: Obstetrics and Gynecology

## 2013-05-23 ENCOUNTER — Ambulatory Visit (INDEPENDENT_AMBULATORY_CARE_PROVIDER_SITE_OTHER): Payer: Self-pay | Admitting: Obstetrics and Gynecology

## 2013-05-23 ENCOUNTER — Encounter: Payer: Self-pay | Admitting: *Deleted

## 2013-05-23 VITALS — BP 100/65 | Temp 97.4°F | Wt 159.0 lb

## 2013-05-23 DIAGNOSIS — Z3480 Encounter for supervision of other normal pregnancy, unspecified trimester: Secondary | ICD-10-CM

## 2013-05-23 DIAGNOSIS — Z3483 Encounter for supervision of other normal pregnancy, third trimester: Secondary | ICD-10-CM

## 2013-05-23 LAB — OB RESULTS CONSOLE GC/CHLAMYDIA: Chlamydia: NEGATIVE

## 2013-05-23 LAB — POCT URINALYSIS DIP (DEVICE)
Ketones, ur: NEGATIVE mg/dL
Nitrite: NEGATIVE
Protein, ur: NEGATIVE mg/dL
Specific Gravity, Urine: 1.025 (ref 1.005–1.030)
Urobilinogen, UA: 0.2 mg/dL (ref 0.0–1.0)
pH: 6 (ref 5.0–8.0)

## 2013-05-23 NOTE — Patient Instructions (Signed)
PEmbarazo  Systems analyst trimestre  (Pregnancy - Third Trimester) El tercer trimestre del Psychiatrist (los ltimos 3 meses) es el perodo en el cual tanto usted como su beb crecen con ms rapidez. El beb alcanza un largo de aproximadamente 50 cm. y pesa entre 2,700 y 4,500 kg. El beb gana ms tejido graso y est listo para la vida fuera del cuerpo de la [place]. Mientras estn en el interior, los bebs tienen perodos de sueo y vigilia, Warehouse manager y tienen hipo. Quizs sienta pequeas contracciones del tero. Este es el falso trabajo de [place]. Tambin se las conoce como contracciones de [place] . Es como una prctica del parto. Los problemas ms habituales de esta etapa del embarazo incluyen mayor dificultad para respirar, hinchazn de las manos y los pies por retencin de lquidos y la necesidad de Geographical information systems officer con ms frecuencia debido a que el tero y el beb presionan sobre la vejiga.  EXAMENES PRENATALES   Durante los [employer], deber seguir realizndose anlisis de [place]. Estas pruebas se realizan para controlar su salud y la del beb. Los [employer] de sangre se Radiographer, therapeutic para [employer] niveles de algunos compuestos de la sangre (hemoglobina). La anemia (bajo nivel de hemoglobina) es frecuente durante el embarazo. Para prevenirla, se administran hierro y vitaminas. Tambin le tomarn nuevas anlisis para descartar diabetes. Podrn repetirle algunas de las [employer] hicieron previamente.  En cada visita le medirn el tamao del tero. Esto permite asegurar que el beb se desarrolla adecuadamente, segn la fecha del embarazo.  Le controlarn la presin arterial en cada visita prenatal. Esto es para asegurarse de que no sufre toxemia.  Le harn un anlisis de orina en cada visita prenatal, para descartar infecciones, diabetes y la presencia de protenas.  Tambin en cada visita controlarn su peso. Esto se realiza para asegurarse que aumenta de peso al ritmo indicado y que usted y  su beb evolucionan normalmente.  En algunas ocasiones se realiza una prueba de ultrasonido para confirmar el correcto desarrollo y evolucin del beb. Esta prueba se realiza con ondas sonoras inofensivas para el beb, de modo que el profesional pueda calcular ms precisamente la fecha del [place].  Analice con su mdico los analgsicos y la anestesia que recibir durante el [place] de parto y [place].  Comente la posibilidad de que necesite una cesrea y qu anestesia se recibir.  Informe a su mdico si sufre violencia familiar mental o fsica. A veces, se indica la prueba especializada sin estrs, la prueba de tolerancia a las contracciones y el perfil biofsico para asegurarse de que el beb no tiene problemas. El estudio del lquido amnitico que rodea al beb se llama amniocentesis. El lquido amnitico se obtiene introduciendo una aguja en el vientre (abdomen ). En ocasiones se lleva a cabo cerca del final del embarazo, si es necesario inducir a un parto. En este caso se realiza para asegurarse que los pulmones del beb estn lo suficientemente maduros como para que pueda vivir fuera del tero. Si los pulmones no han madurado y es peligroso que el beb nazca, se Building services engineer a la madre una inyeccin de [place] , 1 a 2 [address]  [address] antes del [address]. Vivia Budge ayuda a que los pulmones del beb maduren y sea ms seguro su nacimiento.  CAMBIOS QUE OCURREN EN EL TERCER TRIMESTRE DEL EMBARAZO  Su organismo atravesar numerosos cambios durante el [place]. Estos pueden variar de Neomia Dear persona a otra. Converse con el profesional que la asiste acerca los cambios que  usted note y que la preocupen.   Durante el ltimo trimestre probablemente sienta un aumento del apetito. Es normal tener "antojos" de Development worker, community. Esto vara de Neomia Dear persona a otra y de un embarazo a Therapist, art.  Podrn aparecer las primeras estras en las caderas, abdomen y [place]. Estos son cambios normales del cuerpo durante el [place]. No existen  medicamentos ni ejercicios que puedan prevenir [employer].  La constipacin puede tratarse con un laxante o agregando fibra a su dieta. Beber grandes cantidades de lquidos, tomar fibras en forma de vegetales, frutas y granos integrales es de gran [place].  Tambin es beneficioso practicar actividad fsica. Si ha sido una persona Engineer, mining, podr continuar con la [employer] de las actividades durante el mismo. Si ha sido [employer], puede ser beneficioso que comience con un programa de ejercicios, Museum/gallery exhibitions officer. Consulte con el profesional que la asiste antes de comenzar un programa de ejercicios.  Evite el consumo de cigarrillos, el alcohol, los medicamentos no recetados y las "drogas de la calle" durante el Psychiatrist. Estas sustancias qumicas afectan la formacin y el desarrollo del beb. Evite estas sustancias durante todo el embarazo para asegurar el nacimiento de un beb sano.  Podr sentir dolor de espalda, tener vrices en las venas y hemorroides, o si ya los sufra, pueden [place].  Durante el tercer trimestre se cansar con ms facilidad, lo cual es normal.  Los movimientos del beb pueden ser ms fuertes y con ms frecuencia.  Puede que note dificultades para respirar normalmente.  El ombligo puede salir hacia afuera.  A veces sale Veterinary surgeon de las [place], que se llama Product manager.  Podr aparecer Neomia Dear secrecin mucosa con sangre. Esto suele ocurrir [employer] unos [address] y Neomia Dear semana antes del [place]. INSTRUCCIONES PARA EL CUIDADO EN EL HOGAR   Cumpla con las citas de control. Siga las indicaciones del mdico con respecto al uso de [place], los ejercicios y la dieta.  Durante el embarazo debe obtener nutrientes para usted y para su beb. Consuma alimentos balanceados a intervalos regulares. Elija alimentos como carne, pescado, [place] y otros productos lcteos descremados, vegetales, frutas, panes integrales y cereales. El [employer] informar  cul es el aumento de peso ideal.  Las relaciones sexuales pueden continuarse hasta casi el final del embarazo, si no se presentan otros problemas como prdida prematura (antes de [place]) de lquido amnitico, hemorragia vaginal o dolor en el vientre (abdominal).  Realice [employer], si no tiene restricciones. Consulte con el profesional que la asiste si no sabe con certeza si determinados ejercicios son seguros. El mayor aumento de peso se producir en los ltimos 2 trimestres del Psychiatrist. El ejercicio ayuda a:  Engineering geologist.  Mantenerse en forma para el trabajo de parto y [place] .  Perder peso despus del parto.  Haga reposo con frecuencia, con las piernas elevadas, o segn lo necesite para evitar los calambres y el dolor de cintura.  Use un buen sostn o como los que se usan para hacer deportes para Paramedic la sensibilidad de las [place]. Tambin puede serle til si lo [place] mientras duerme. Si pierde Product manager, podr [employer].  No utilice la baera con agua caliente, baos turcos y saunas.   Colquese el cinturn de seguridad cuando conduzca. Este la proteger a usted y al beb en caso de accidente.  Evite comer carne cruda y el contacto con los utensilios y desperdicios de los gatos. Estos  elementos contienen grmenes que pueden causar defectos de nacimiento en el beb.  Es fcil perder algo de orina durante el [place]. Apretar y Chief Operating Officer los msculos de la pelvis la ayudar con este problema. Practique detener la miccin cuando est en el bao. Estos son los mismos msculos que Development worker, international aid. Son [employer] mismos msculos que utiliza cuando trata de evitar despedir gases. Puede practicar apretando estos msculos [employer], y repetir esto tres veces por da aproximadamente. Una vez que conozca qu msculos debe apretar, no realice estos ejercicios durante la miccin. Puede favorecerle una infeccin si la orina vuelve hacia  atrs.  Pida ayuda si tienen necesidades financieras, teraputicas o nutricionales. El profesional podr ayudarla con respecto a estas necesidades, o derivarla a otros especialistas.  Haga una lista de nmeros telefnicos de emergencia y tngalos disponibles.  Planifique como obtener ayuda de familiares o amigos cuando regrese a Programmer, applications hospital.  Hacer un ensayo sobre la partida al hospital.  [place] clases prenatales con el padre para entender, practicar y hacer preguntas sobre el [place] de parto y el alumbramiento.  Preparar la habitacin del beb / busque Fatima Blank.  No viaje fuera de la ciudad a menos que sea absolutamente necesario y con el asesoramiento de su mdico.  Use slo zapatos de tacn bajo o sin tacn para tener mejor equilibrio y Automotive engineer cadas. USO DE MEDICAMENTOS Y CONSUMO DE DROGAS DURANTE EL [facility]   Tome las vitaminas apropiadas para esta etapa tal como se le indic. Las vitaminas deben contener un miligramo de cido flico. Guarde todas las vitaminas fuera del alcance de los nios. La ingestin de slo un par de vitaminas o tabletas que contengan hierro pueden ocasionar la [employer] en un beb o en un nio pequeo.  Evite el uso de [employer] Omaha, incluyendo hierbas, medicamentos de [place], sin receta o que no hayan sido sugeridos por su mdico. Slo tome medicamentos de venta libre o medicamentos recetados para Chief Technology Officer, Environmental health practitioner o fiebre como lo indique su mdico. No tome aspirina, ibuprofeno o naproxeno excepto que su mdico se lo indique.  Infrmele al profesional si consume alguna droga.  El alcohol se relaciona con ciertos defectos congnitos. Incluye el sndrome de alcoholismo fetal. Debe evitar absolutamente el consumo de alcohol, en cualquier forma. El fumar produce baja tasa de natalidad y bebs prematuros.  Las drogas ilegales o de la calle son muy perjudiciales para el beb. Estn absolutamente prohibidas. Un beb que nace de [employer], ser adicto al nacer. Ese beb tendr los mismos sntomas de abstinencia que un adulto. SOLICITE ATENCIN MDICA SI:  Tiene preguntas o preocupaciones relacionadas con el embarazo. Es mejor que llame para formular las preguntas si no puede esperar hasta la prxima visita, que sentirse preocupada por ellas.  SOLICITE ATENCIN MDICA DE INMEDIATO SI:   La temperatura oral le sube a ms de 38,9 C (102 F) o lo que su mdico le indique.  Tiene una prdida de lquido por la vagina (canal de parto). Si sospecha una ruptura de las [place], tmese la temperatura y llame al profesional para informarlo sobre esto.  Observa unas pequeas manchas, una hemorragia vaginal o elimina cogulos. Notifique al profesional acerca de la cantidad y de cuntos apsitos est utilizando.  Presenta un olor desagradable en la secrecin vaginal y observa un cambio en el color, de transparente a blanco.  Ha vomitado durante ms de 24 horas.  Siente escalofros o le sube la fiebre.  Conley Rolls  falta el aire.  Siente ardor al Beatrix Shipper.  Baja o sube ms de 2 libras (900 g), o segn lo indicado por el profesional que la asiste.  Observa que sbitamente se le hinchan el rostro, las manos, los pies o las piernas.  Siente dolor en el vientre (abdominal). Las [employer] en el ligamento redondo son Neomia Dear causa benigna frecuente de dolor abdominal durante el embarazo. El profesional que la asiste deber evaluarla.  Presenta dolor de cabeza intenso que no se [place].  Tiene problemas visuales, visin doble o borrosa.  Si no siente los movimientos del beb durante ms de 1 hora. Si piensa que el beb no se mueve tanto como lo haca habitualmente, coma algo que Psychologist, clinical y [employer] lado izquierdo durante [place]. El beb debe moverse al menos 4  5 veces por hora. Comunquese inmediatamente si el beb se mueve menos que lo indicado.  Se cae, se ve involucrada en un accidente automovilstico o sufre algn  tipo de traumatismo.  En su hogar hay violencia mental o fsica. Document Released: 06/02/2005 Document Revised: 05/17/2012 [facility] Patient Information 2014 [place], [place].

## 2013-05-23 NOTE — Addendum Note (Signed)
Addended by: Franchot Mimes on: 05/23/2013 10:30 AM   Modules accepted: Orders

## 2013-05-23 NOTE — Progress Notes (Signed)
Doing well. GBS, GC/CT done. Reviewed plans.

## 2013-05-23 NOTE — Progress Notes (Signed)
Pulse  84  Edema trace in hands and feet.

## 2013-05-24 LAB — GC/CHLAMYDIA PROBE AMP
CT Probe RNA: NEGATIVE
GC Probe RNA: NEGATIVE

## 2013-06-06 ENCOUNTER — Ambulatory Visit (INDEPENDENT_AMBULATORY_CARE_PROVIDER_SITE_OTHER): Payer: Self-pay | Admitting: Family

## 2013-06-06 VITALS — BP 97/63 | Temp 98.0°F | Wt 160.9 lb

## 2013-06-06 DIAGNOSIS — Z3480 Encounter for supervision of other normal pregnancy, unspecified trimester: Secondary | ICD-10-CM

## 2013-06-06 DIAGNOSIS — Z23 Encounter for immunization: Secondary | ICD-10-CM

## 2013-06-06 DIAGNOSIS — Z3483 Encounter for supervision of other normal pregnancy, third trimester: Secondary | ICD-10-CM

## 2013-06-06 LAB — POCT URINALYSIS DIP (DEVICE)
Bilirubin Urine: NEGATIVE
Glucose, UA: NEGATIVE mg/dL
Ketones, ur: NEGATIVE mg/dL
Specific Gravity, Urine: 1.025 (ref 1.005–1.030)

## 2013-06-06 MED ORDER — INFLUENZA VIRUS VACC SPLIT PF IM SUSP
0.5000 mL | Freq: Once | INTRAMUSCULAR | Status: AC
  Administered 2013-06-06: 0.5 mL via INTRAMUSCULAR

## 2013-06-06 NOTE — Progress Notes (Signed)
P=88,  States trace edema feet, hands, face. Used Equities trader

## 2013-06-06 NOTE — Progress Notes (Signed)
Reviewed GBS test results.  Doing well, no questions or concerns.  Flu vaccination today.

## 2013-06-12 ENCOUNTER — Encounter: Payer: Self-pay | Admitting: Family Medicine

## 2013-06-12 ENCOUNTER — Ambulatory Visit (INDEPENDENT_AMBULATORY_CARE_PROVIDER_SITE_OTHER): Payer: Self-pay | Admitting: Family Medicine

## 2013-06-12 VITALS — BP 111/72 | Temp 97.1°F | Wt 163.5 lb

## 2013-06-12 DIAGNOSIS — Z3483 Encounter for supervision of other normal pregnancy, third trimester: Secondary | ICD-10-CM

## 2013-06-12 DIAGNOSIS — Z3480 Encounter for supervision of other normal pregnancy, unspecified trimester: Secondary | ICD-10-CM

## 2013-06-12 LAB — POCT URINALYSIS DIP (DEVICE)
Bilirubin Urine: NEGATIVE
Glucose, UA: NEGATIVE mg/dL
Hgb urine dipstick: NEGATIVE
Nitrite: NEGATIVE
Specific Gravity, Urine: 1.025 (ref 1.005–1.030)

## 2013-06-12 NOTE — Progress Notes (Signed)
  Subjective:    Leslie Villarreal is a 27 y.o. female being seen today for her obstetrical visit. She is at [redacted]w[redacted]d gestation. Patient reports no bleeding, no contractions, no cramping and no leaking. Fetal movement: normal.  Menstrual History: OB History   Grav Para Term Preterm Abortions TAB SAB Ect Mult Living   2 1 1  0 0 0 0 0 0 1      Patient's last menstrual period was 09/20/2012.    The following portions of the patient's history were reviewed and updated as appropriate: allergies, current medications, past family history, past medical history, past social history, past surgical history and problem list.  Review of Systems Pertinent items are noted in HPI.   Objective:    BP 111/72  Temp(Src) 97.1 F (36.2 C)  Wt 74.163 kg (163 lb 8 oz)  BMI 28.97 kg/m2  LMP 09/20/2012 FHT: 150 BPM  Uterine Size: 37 cm  Presentations:   Pelvic Exam:                          Assessment:   Leslie Villarreal is a 27 y.o. G2P1001 at [redacted]w[redacted]d  here for ROB visit. Negative (09/17 0000)  Discussed with Patient:  - Plans to breast/bottle feed.  All questions answered. - Continue prenatal vitamins. - Reviewed fetal kick counts Pt to perform daily at a time when the baby is active, lie laterally with both hands on belly in quiet room and count all movements (hiccups, shoulder rolls, obvious kicks, etc); pt is to report to clinic MAU for less than 10 movements felt in a 2 hour time period-pt told as soon as she counts 10 movements the count is complete.  - Routine precautions discussed (depression, infection s/s).   Patient provided with all pertinent phone numbers for emergencies. - RTC for any VB, regular, painful cramps/ctxs occurring at a rate of >2/10 min, fever (100.5 or higher), n/v/d, any pain that is unresolving or worsening, LOF, decreased fetal movement, CP, SOB, edema -RTC in one week for next visit.  Problems: Patient Active Problem List   Diagnosis Date Noted  .  Supervision of normal intrauterine pregnancy in multigravida 12/13/2012  . CIN III (cervical intraepithelial neoplasia grade III) with severe dysplasia 05/04/2011    To Do: 1. NTD  [ ]  Vaccines: Flu: Recd fluTdap: rec'd [ ]  BCM: Nexplanon [ ]  Readiness: baby has a place to sleep, car seat, other baby necessities.  Edu: [x ] PTL precautions; [ ]  BF class; [ ]  childbirth class; [ ]   BF counseling;

## 2013-06-12 NOTE — Patient Instructions (Signed)
Embarazo  Systems analyst trimestre  (Pregnancy - Third Trimester) El tercer trimestre del Psychiatrist (los ltimos 3 meses) es el perodo en el cual tanto usted como su beb crecen con ms rapidez. El beb alcanza un largo de aproximadamente 50 cm. y pesa entre 2,700 y 4,500 kg. El beb gana ms tejido graso y est listo para la vida fuera del cuerpo de la [place]. Mientras estn en el interior, los bebs tienen perodos de sueo y vigilia, Warehouse manager y tienen hipo. Quizs sienta pequeas contracciones del tero. Este es el falso trabajo de [place]. Tambin se las conoce como contracciones de [place] . Es como una prctica del parto. Los problemas ms habituales de esta etapa del embarazo incluyen mayor dificultad para respirar, hinchazn de las manos y los pies por retencin de lquidos y la necesidad de Geographical information systems officer con ms frecuencia debido a que el tero y el beb presionan sobre la vejiga.  EXAMENES PRENATALES   Durante los [employer], deber seguir realizndose anlisis de [place]. Estas pruebas se realizan para controlar su salud y la del beb. Los [employer] de sangre se Radiographer, therapeutic para [employer] niveles de algunos compuestos de la sangre (hemoglobina). La anemia (bajo nivel de hemoglobina) es frecuente durante el embarazo. Para prevenirla, se administran hierro y vitaminas. Tambin le tomarn nuevas anlisis para descartar diabetes. Podrn repetirle algunas de las [employer] hicieron previamente.  En cada visita le medirn el tamao del tero. Esto permite asegurar que el beb se desarrolla adecuadamente, segn la fecha del embarazo.  Le controlarn la presin arterial en cada visita prenatal. Esto es para asegurarse de que no sufre toxemia.  Le harn un anlisis de orina en cada visita prenatal, para descartar infecciones, diabetes y la presencia de protenas.  Tambin en cada visita controlarn su peso. Esto se realiza para asegurarse que aumenta de peso al ritmo indicado y que usted y  su beb evolucionan normalmente.  En algunas ocasiones se realiza una prueba de ultrasonido para confirmar el correcto desarrollo y evolucin del beb. Esta prueba se realiza con ondas sonoras inofensivas para el beb, de modo que el profesional pueda calcular ms precisamente la fecha del [place].  Analice con su mdico los analgsicos y la anestesia que recibir durante el [place] de parto y [place].  Comente la posibilidad de que necesite una cesrea y qu anestesia se recibir.  Informe a su mdico si sufre violencia familiar mental o fsica. A veces, se indica la prueba especializada sin estrs, la prueba de tolerancia a las contracciones y el perfil biofsico para asegurarse de que el beb no tiene problemas. El estudio del lquido amnitico que rodea al beb se llama amniocentesis. El lquido amnitico se obtiene introduciendo una aguja en el vientre (abdomen ). En ocasiones se lleva a cabo cerca del final del embarazo, si es necesario inducir a un parto. En este caso se realiza para asegurarse que los pulmones del beb estn lo suficientemente maduros como para que pueda vivir fuera del tero. Si los pulmones no han madurado y es peligroso que el beb nazca, se Building services engineer a la madre una inyeccin de [place] , 1 a 2 [address]  [address] antes del [address]. Vivia Budge ayuda a que los pulmones del beb maduren y sea ms seguro su nacimiento.  CAMBIOS QUE OCURREN EN EL TERCER TRIMESTRE DEL EMBARAZO  Su organismo atravesar numerosos cambios durante el [place]. Estos pueden variar de Neomia Dear persona a otra. Converse con el profesional que la asiste acerca los cambios que  usted note y que la preocupen.   Durante el ltimo trimestre probablemente sienta un aumento del apetito. Es normal tener "antojos" de Development worker, community. Esto vara de Neomia Dear persona a otra y de un embarazo a Therapist, art.  Podrn aparecer las primeras estras en las caderas, abdomen y [place]. Estos son cambios normales del cuerpo durante el [place]. No existen  medicamentos ni ejercicios que puedan prevenir [employer].  La constipacin puede tratarse con un laxante o agregando fibra a su dieta. Beber grandes cantidades de lquidos, tomar fibras en forma de vegetales, frutas y granos integrales es de gran [place].  Tambin es beneficioso practicar actividad fsica. Si ha sido una persona Engineer, mining, podr continuar con la [employer] de las actividades durante el mismo. Si ha sido [employer], puede ser beneficioso que comience con un programa de ejercicios, Museum/gallery exhibitions officer. Consulte con el profesional que la asiste antes de comenzar un programa de ejercicios.  Evite el consumo de cigarrillos, el alcohol, los medicamentos no recetados y las "drogas de la calle" durante el Psychiatrist. Estas sustancias qumicas afectan la formacin y el desarrollo del beb. Evite estas sustancias durante todo el embarazo para asegurar el nacimiento de un beb sano.  Podr sentir dolor de espalda, tener vrices en las venas y hemorroides, o si ya los sufra, pueden [place].  Durante el tercer trimestre se cansar con ms facilidad, lo cual es normal.  Los movimientos del beb pueden ser ms fuertes y con ms frecuencia.  Puede que note dificultades para respirar normalmente.  El ombligo puede salir hacia afuera.  A veces sale Veterinary surgeon de las [place], que se llama Product manager.  Podr aparecer Neomia Dear secrecin mucosa con sangre. Esto suele ocurrir [employer] unos [address] y Neomia Dear semana antes del [place]. INSTRUCCIONES PARA EL CUIDADO EN EL HOGAR   Cumpla con las citas de control. Siga las indicaciones del mdico con respecto al uso de [place], los ejercicios y la dieta.  Durante el embarazo debe obtener nutrientes para usted y para su beb. Consuma alimentos balanceados a intervalos regulares. Elija alimentos como carne, pescado, [place] y otros productos lcteos descremados, vegetales, frutas, panes integrales y cereales. El [employer] informar  cul es el aumento de peso ideal.  Las relaciones sexuales pueden continuarse hasta casi el final del embarazo, si no se presentan otros problemas como prdida prematura (antes de [place]) de lquido amnitico, hemorragia vaginal o dolor en el vientre (abdominal).  Realice [employer], si no tiene restricciones. Consulte con el profesional que la asiste si no sabe con certeza si determinados ejercicios son seguros. El mayor aumento de peso se producir en los ltimos 2 trimestres del Psychiatrist. El ejercicio ayuda a:  Engineering geologist.  Mantenerse en forma para el trabajo de parto y [place] .  Perder peso despus del parto.  Haga reposo con frecuencia, con las piernas elevadas, o segn lo necesite para evitar los calambres y el dolor de cintura.  Use un buen sostn o como los que se usan para hacer deportes para Paramedic la sensibilidad de las [place]. Tambin puede serle til si lo [place] mientras duerme. Si pierde Product manager, podr [employer].  No utilice la baera con agua caliente, baos turcos y saunas.   Colquese el cinturn de seguridad cuando conduzca. Este la proteger a usted y al beb en caso de accidente.  Evite comer carne cruda y el contacto con los utensilios y desperdicios de los gatos. Estos  elementos contienen grmenes que pueden causar defectos de nacimiento en el beb.  Es fcil perder algo de orina durante el [place]. Apretar y Chief Operating Officer los msculos de la pelvis la ayudar con este problema. Practique detener la miccin cuando est en el bao. Estos son los mismos msculos que Development worker, international aid. Son [employer] mismos msculos que utiliza cuando trata de evitar despedir gases. Puede practicar apretando estos msculos [employer], y repetir esto tres veces por da aproximadamente. Una vez que conozca qu msculos debe apretar, no realice estos ejercicios durante la miccin. Puede favorecerle una infeccin si la orina vuelve hacia  atrs.  Pida ayuda si tienen necesidades financieras, teraputicas o nutricionales. El profesional podr ayudarla con respecto a estas necesidades, o derivarla a otros especialistas.  Haga una lista de nmeros telefnicos de emergencia y tngalos disponibles.  Planifique como obtener ayuda de familiares o amigos cuando regrese a Programmer, applications hospital.  Hacer un ensayo sobre la partida al hospital.  Hugo clases prenatales con el padre para entender, practicar y hacer preguntas sobre el [place] de parto y el alumbramiento.  Preparar la habitacin del beb / busque Fatima Blank.  No viaje fuera de la ciudad a menos que sea absolutamente necesario y con el asesoramiento de su mdico.  Use slo zapatos de tacn bajo o sin tacn para tener mejor equilibrio y Automotive engineer cadas. USO DE MEDICAMENTOS Y CONSUMO DE DROGAS DURANTE EL [facility]   Tome las vitaminas apropiadas para esta etapa tal como se le indic. Las vitaminas deben contener un miligramo de cido flico. Guarde todas las vitaminas fuera del alcance de los nios. La ingestin de slo un par de vitaminas o tabletas que contengan hierro pueden ocasionar la [employer] en un beb o en un nio pequeo.  Evite el uso de [employer], incluyendo hierbas, medicamentos de [place], sin receta o que no hayan sido sugeridos por su mdico. Slo tome medicamentos de venta libre o medicamentos recetados para Chief Technology Officer, Environmental health practitioner o fiebre como lo indique su mdico. No tome aspirina, ibuprofeno o naproxeno excepto que su mdico se lo indique.  Infrmele al profesional si consume alguna droga.  El alcohol se relaciona con ciertos defectos congnitos. Incluye el sndrome de alcoholismo fetal. Debe evitar absolutamente el consumo de alcohol, en cualquier forma. El fumar produce baja tasa de natalidad y bebs prematuros.  Las drogas ilegales o de la calle son muy perjudiciales para el beb. Estn absolutamente prohibidas. Un beb que nace de [employer], ser adicto al nacer. Ese beb tendr los mismos sntomas de abstinencia que un adulto. SOLICITE ATENCIN MDICA SI:  Tiene preguntas o preocupaciones relacionadas con el embarazo. Es mejor que llame para formular las preguntas si no puede esperar hasta la prxima visita, que sentirse preocupada por ellas.  SOLICITE ATENCIN MDICA DE INMEDIATO SI:   La temperatura oral le sube a ms de 38,9 C (102 F) o lo que su mdico le indique.  Tiene una prdida de lquido por la vagina (canal de parto). Si sospecha una ruptura de las [place], tmese la temperatura y llame al profesional para informarlo sobre esto.  Observa unas pequeas manchas, una hemorragia vaginal o elimina cogulos. Notifique al profesional acerca de la cantidad y de cuntos apsitos est utilizando.  Presenta un olor desagradable en la secrecin vaginal y observa un cambio en el color, de transparente a blanco.  Ha vomitado durante ms de 24 horas.  Siente escalofros o le sube la fiebre.  Conley Rolls  falta el aire.  Siente ardor al Beatrix Shipper.  Baja o sube ms de 2 libras (900 g), o segn lo indicado por el profesional que la asiste.  Observa que sbitamente se le hinchan el rostro, las manos, los pies o las piernas.  Siente dolor en el vientre (abdominal). Las [employer] en el ligamento redondo son Neomia Dear causa benigna frecuente de dolor abdominal durante el embarazo. El profesional que la asiste deber evaluarla.  Presenta dolor de cabeza intenso que no se [place].  Tiene problemas visuales, visin doble o borrosa.  Si no siente los movimientos del beb durante ms de 1 hora. Si piensa que el beb no se mueve tanto como lo haca habitualmente, coma algo que Psychologist, clinical y [employer] lado izquierdo durante [place]. El beb debe moverse al menos 4  5 veces por hora. Comunquese inmediatamente si el beb se mueve menos que lo indicado.  Se cae, se ve involucrada en un accidente automovilstico o sufre algn  tipo de traumatismo.  En su hogar hay violencia mental o fsica. Document Released: 06/02/2005 Document Revised: 05/17/2012 [facility] Patient Information 2014 [place], [place]. Place 32-42 weeks prenatal visit patient instructions here.

## 2013-06-12 NOTE — [facility] Notes (Signed)
P = 95    Pt reports swelling of face, feet and hands x3 weeks.

## 2013-06-19 ENCOUNTER — Ambulatory Visit (INDEPENDENT_AMBULATORY_CARE_PROVIDER_SITE_OTHER): Payer: Self-pay | Admitting: Family Medicine

## 2013-06-19 ENCOUNTER — Encounter: Payer: Self-pay | Admitting: Family Medicine

## 2013-06-19 VITALS — BP 121/78 | Temp 98.3°F | Wt 163.7 lb

## 2013-06-19 DIAGNOSIS — O209 Hemorrhage in early pregnancy, unspecified: Secondary | ICD-10-CM

## 2013-06-19 DIAGNOSIS — Z3483 Encounter for supervision of other normal pregnancy, third trimester: Secondary | ICD-10-CM

## 2013-06-19 DIAGNOSIS — Z3480 Encounter for supervision of other normal pregnancy, unspecified trimester: Secondary | ICD-10-CM

## 2013-06-19 DIAGNOSIS — O2390 Unspecified genitourinary tract infection in pregnancy, unspecified trimester: Secondary | ICD-10-CM

## 2013-06-19 LAB — POCT URINALYSIS DIP (DEVICE)
Ketones, ur: NEGATIVE mg/dL
pH: 6 (ref 5.0–8.0)

## 2013-06-19 NOTE — [facility] Notes (Signed)
P= 106 pt c/o of pain/pressure in lower abd during the day.

## 2013-06-19 NOTE — [facility] Notes (Signed)
Pt doing well overall. Ready to have a baby - no contraction. No lof, vb. +FM  O: see flowsheets   A/P - doing well - labor precautions discussed - urine appears dirty, will send for culture - f/u in 1 week. Discussed post dates testing also and would likely start the following week.

## 2013-06-19 NOTE — Patient Instructions (Signed)
Embarazo  Tercer trimestre  (Pregnancy - Third Trimester) El tercer trimestre del embarazo (los ltimos 3 meses) es el perodo en el cual tanto usted como su beb crecen con ms rapidez. El beb alcanza un largo de aproximadamente 50 cm. y pesa entre 2,700 y 4,500 kg. El beb gana ms tejido graso y est listo para la vida fuera del cuerpo de la madre. Mientras estn en el interior, los bebs tienen perodos de sueo y vigilia, succionan el pulgar y tienen hipo. Quizs sienta pequeas contracciones del tero. Este es el falso trabajo de parto. Tambin se las conoce como contracciones de Braxton-Hicks . Es como una prctica del parto. Los problemas ms habituales de esta etapa del embarazo incluyen mayor dificultad para respirar, hinchazn de las manos y los pies por retencin de lquidos y la necesidad de orinar con ms frecuencia debido a que el tero y el beb presionan sobre la vejiga.  EXAMENES PRENATALES   Durante los exmenes prenatales, deber seguir realizndose anlisis de sangre. Estas pruebas se realizan para controlar su salud y la del beb. Los anlisis de sangre se realizan para conocer los niveles de algunos compuestos de la sangre (hemoglobina). La anemia (bajo nivel de hemoglobina) es frecuente durante el embarazo. Para prevenirla, se administran hierro y vitaminas. Tambin le tomarn nuevas anlisis para descartar diabetes. Podrn repetirle algunas de las pruebas que le hicieron previamente.  En cada visita le medirn el tamao del tero. Esto permite asegurar que el beb se desarrolla adecuadamente, segn la fecha del embarazo.  Le controlarn la presin arterial en cada visita prenatal. Esto es para asegurarse de que no sufre toxemia.  Le harn un anlisis de orina en cada visita prenatal, para descartar infecciones, diabetes y la presencia de protenas.  Tambin en cada visita controlarn su peso. Esto se realiza para asegurarse que aumenta de peso al ritmo indicado y que usted y su  beb evolucionan normalmente.  En algunas ocasiones se realiza una prueba de ultrasonido para confirmar el correcto desarrollo y evolucin del beb. Esta prueba se realiza con ondas sonoras inofensivas para el beb, de modo que el profesional pueda calcular ms precisamente la fecha del parto.  Analice con su mdico los analgsicos y la anestesia que recibir durante el trabajo de parto y el parto.  Comente la posibilidad de que necesite una cesrea y qu anestesia se recibir.  Informe a su mdico si sufre violencia familiar mental o fsica. A veces, se indica la prueba especializada sin estrs, la prueba de tolerancia a las contracciones y el perfil biofsico para asegurarse de que el beb no tiene problemas. El estudio del lquido amnitico que rodea al beb se llama amniocentesis. El lquido amnitico se obtiene introduciendo una aguja en el vientre (abdomen ). En ocasiones se lleva a cabo cerca del final del embarazo, si es necesario inducir a un parto. En este caso se realiza para asegurarse que los pulmones del beb estn lo suficientemente maduros como para que pueda vivir fuera del tero. Si los pulmones no han madurado y es peligroso que el beb nazca, se administrar a la madre una inyeccin de cortisona , 1 a 2 das antes del parto. . Esto ayuda a que los pulmones del beb maduren y sea ms seguro su nacimiento.  CAMBIOS QUE OCURREN EN EL TERCER TRIMESTRE DEL EMBARAZO  Su organismo atravesar numerosos cambios durante el embarazo. Estos pueden variar de una persona a otra. Converse con el profesional que la asiste acerca los cambios que   usted note y que la preocupen.   Durante el ltimo trimestre probablemente sienta un aumento del apetito. Es normal tener "antojos" de ciertas comidas. Esto vara de una persona a otra y de un embarazo a otro.  Podrn aparecer las primeras estras en las caderas, abdomen y mamas. Estos son cambios normales del cuerpo durante el embarazo. No existen  medicamentos ni ejercicios que puedan prevenir estos cambios.  La constipacin puede tratarse con un laxante o agregando fibra a su dieta. Beber grandes cantidades de lquidos, tomar fibras en forma de vegetales, frutas y granos integrales es de gran ayuda.  Tambin es beneficioso practicar actividad fsica. Si ha sido una persona activa hasta el embarazo, podr continuar con la mayora de las actividades durante el mismo. Si ha sido menos activa, puede ser beneficioso que comience con un programa de ejercicios, como realizar caminatas. Consulte con el profesional que la asiste antes de comenzar un programa de ejercicios.  Evite el consumo de cigarrillos, el alcohol, los medicamentos no recetados y las "drogas de la calle" durante el embarazo. Estas sustancias qumicas afectan la formacin y el desarrollo del beb. Evite estas sustancias durante todo el embarazo para asegurar el nacimiento de un beb sano.  Podr sentir dolor de espalda, tener vrices en las venas y hemorroides, o si ya los sufra, pueden empeorar.  Durante el tercer trimestre se cansar con ms facilidad, lo cual es normal.  Los movimientos del beb pueden ser ms fuertes y con ms frecuencia.  Puede que note dificultades para respirar normalmente.  El ombligo puede salir hacia afuera.  A veces sale una secrecin amarilla de las mamas, que se llama calostro.  Podr aparecer una secrecin mucosa con sangre. Esto suele ocurrir entre unos pocos das y una semana antes del parto. INSTRUCCIONES PARA EL CUIDADO EN EL HOGAR   Cumpla con las citas de control. Siga las indicaciones del mdico con respecto al uso de medicamentos, los ejercicios y la dieta.  Durante el embarazo debe obtener nutrientes para usted y para su beb. Consuma alimentos balanceados a intervalos regulares. Elija alimentos como carne, pescado, leche y otros productos lcteos descremados, vegetales, frutas, panes integrales y cereales. El mdico le informar  cul es el aumento de peso ideal.  Las relaciones sexuales pueden continuarse hasta casi el final del embarazo, si no se presentan otros problemas como prdida prematura (antes de tiempo) de lquido amnitico, hemorragia vaginal o dolor en el vientre (abdominal).  Realice actividad fsica todos los das, si no tiene restricciones. Consulte con el profesional que la asiste si no sabe con certeza si determinados ejercicios son seguros. El mayor aumento de peso se producir en los ltimos 2 trimestres del embarazo. El ejercicio ayuda a:  Controlar su peso.  Mantenerse en forma para el trabajo de parto y el parto .  Perder peso despus del parto.  Haga reposo con frecuencia, con las piernas elevadas, o segn lo necesite para evitar los calambres y el dolor de cintura.  Use un buen sostn o como los que se usan para hacer deportes para aliviar la sensibilidad de las mamas. Tambin puede serle til si lo usa mientras duerme. Si pierde calostro, podr utilizar apsitos en el sostn.  No utilice la baera con agua caliente, baos turcos y saunas.   Colquese el cinturn de seguridad cuando conduzca. Este la proteger a usted y al beb en caso de accidente.  Evite comer carne cruda y el contacto con los utensilios y desperdicios de los gatos. Estos   elementos contienen grmenes que pueden causar defectos de nacimiento en el beb.  Es fcil perder algo de orina durante el embarazo. Apretar y fortalecer los msculos de la pelvis la ayudar con este problema. Practique detener la miccin cuando est en el bao. Estos son los mismos msculos que necesita fortalecer. Son tambin los mismos msculos que utiliza cuando trata de evitar despedir gases. Puede practicar apretando estos msculos diez veces, y repetir esto tres veces por da aproximadamente. Una vez que conozca qu msculos debe apretar, no realice estos ejercicios durante la miccin. Puede favorecerle una infeccin si la orina vuelve hacia  atrs.  Pida ayuda si tienen necesidades financieras, teraputicas o nutricionales. El profesional podr ayudarla con respecto a estas necesidades, o derivarla a otros especialistas.  Haga una lista de nmeros telefnicos de emergencia y tngalos disponibles.  Planifique como obtener ayuda de familiares o amigos cuando regrese a casa desde el hospital.  Hacer un ensayo sobre la partida al hospital.  Tome clases prenatales con el padre para entender, practicar y hacer preguntas sobre el trabajo de parto y el alumbramiento.  Preparar la habitacin del beb / busque una guardera.  No viaje fuera de la ciudad a menos que sea absolutamente necesario y con el asesoramiento de su mdico.  Use slo zapatos de tacn bajo o sin tacn para tener mejor equilibrio y evitar cadas. USO DE MEDICAMENTOS Y CONSUMO DE DROGAS DURANTE EL EMBARAZO   Tome las vitaminas apropiadas para esta etapa tal como se le indic. Las vitaminas deben contener un miligramo de cido flico. Guarde todas las vitaminas fuera del alcance de los nios. La ingestin de slo un par de vitaminas o tabletas que contengan hierro pueden ocasionar la muerte en un beb o en un nio pequeo.  Evite el uso de todos los medicamentos, incluyendo hierbas, medicamentos de venta libre, sin receta o que no hayan sido sugeridos por su mdico. Slo tome medicamentos de venta libre o medicamentos recetados para el dolor, el malestar o fiebre como lo indique su mdico. No tome aspirina, ibuprofeno o naproxeno excepto que su mdico se lo indique.  Infrmele al profesional si consume alguna droga.  El alcohol se relaciona con ciertos defectos congnitos. Incluye el sndrome de alcoholismo fetal. Debe evitar absolutamente el consumo de alcohol, en cualquier forma. El fumar produce baja tasa de natalidad y bebs prematuros.  Las drogas ilegales o de la calle son muy perjudiciales para el beb. Estn absolutamente prohibidas. Un beb que nace de una  madre adicta, ser adicto al nacer. Ese beb tendr los mismos sntomas de abstinencia que un adulto. SOLICITE ATENCIN MDICA SI:  Tiene preguntas o preocupaciones relacionadas con el embarazo. Es mejor que llame para formular las preguntas si no puede esperar hasta la prxima visita, que sentirse preocupada por ellas.  SOLICITE ATENCIN MDICA DE INMEDIATO SI:   La temperatura oral le sube a ms de 38,9 C (102 F) o lo que su mdico le indique.  Tiene una prdida de lquido por la vagina (canal de parto). Si sospecha una ruptura de las membranas, tmese la temperatura y llame al profesional para informarlo sobre esto.  Observa unas pequeas manchas, una hemorragia vaginal o elimina cogulos. Notifique al profesional acerca de la cantidad y de cuntos apsitos est utilizando.  Presenta un olor desagradable en la secrecin vaginal y observa un cambio en el color, de transparente a blanco.  Ha vomitado durante ms de 24 horas.  Siente escalofros o le sube la fiebre.  Le   falta el aire.  Siente ardor al orinar.  Baja o sube ms de 2 libras (900 g), o segn lo indicado por el profesional que la asiste.  Observa que sbitamente se le hinchan el rostro, las manos, los pies o las piernas.  Siente dolor en el vientre (abdominal). Las molestias en el ligamento redondo son una causa benigna frecuente de dolor abdominal durante el embarazo. El profesional que la asiste deber evaluarla.  Presenta dolor de cabeza intenso que no se alivia.  Tiene problemas visuales, visin doble o borrosa.  Si no siente los movimientos del beb durante ms de 1 hora. Si piensa que el beb no se mueve tanto como lo haca habitualmente, coma algo que contenga azcar y recustese sobre el lado izquierdo durante una hora. El beb debe moverse al menos 4  5 veces por hora. Comunquese inmediatamente si el beb se mueve menos que lo indicado.  Se cae, se ve involucrada en un accidente automovilstico o sufre algn  tipo de traumatismo.  En su hogar hay violencia mental o fsica. Document Released: 06/02/2005 Document Revised: 05/17/2012 [employer] Patient Information 2014 [employer].  

## 2013-06-28 ENCOUNTER — Encounter: Payer: Self-pay | Admitting: Obstetrics & Gynecology

## 2013-06-28 ENCOUNTER — Encounter (HOSPITAL_COMMUNITY): Payer: Self-pay | Admitting: *Deleted

## 2013-06-28 ENCOUNTER — Inpatient Hospital Stay (HOSPITAL_COMMUNITY)
Admission: AD | Admit: 2013-06-28 | Discharge: 2013-06-29 | DRG: 774 | Disposition: A | Discharge status: Home / Self Care | Payer: Medicaid Other | Source: Ambulatory Visit | Attending: Obstetrics & Gynecology | Admitting: Obstetrics & Gynecology

## 2013-06-28 ENCOUNTER — Encounter (HOSPITAL_COMMUNITY): Payer: Medicaid Other | Admitting: Anesthesiology

## 2013-06-28 ENCOUNTER — Inpatient Hospital Stay (HOSPITAL_COMMUNITY): Payer: Medicaid Other | Admitting: Anesthesiology

## 2013-06-28 DIAGNOSIS — Z3482 Encounter for supervision of other normal pregnancy, second trimester: Secondary | ICD-10-CM

## 2013-06-28 LAB — CBC
HCT: 37.7 % (ref 36.0–46.0)
Hemoglobin: 12.6 g/dL (ref 12.0–15.0)
MCH: 27.2 pg (ref 26.0–34.0)
MCHC: 33.4 g/dL (ref 30.0–36.0)
RBC: 4.64 MIL/uL (ref 3.87–5.11)

## 2013-06-28 LAB — TYPE AND SCREEN
ABO/RH(D): O POS
Antibody Screen: NEGATIVE

## 2013-06-28 MED ORDER — OXYTOCIN BOLUS FROM INFUSION: 500.0000 mL | INTRAVENOUS | Status: DC

## 2013-06-28 MED ORDER — OXYTOCIN 40 UNITS IN LACTATED RINGERS INFUSION - SIMPLE MED
62.5000 mL/h | INTRAVENOUS | Status: DC
  Administered 2013-06-28: 62.5 mL/h via INTRAVENOUS
  Filled 2013-06-28: qty 1000

## 2013-06-28 MED ORDER — METHYLERGONOVINE MALEATE 0.2 MG/ML IJ SOLN
INTRAMUSCULAR | Status: AC
  Administered 2013-06-28: 0.2 mg
  Filled 2013-06-28: qty 1

## 2013-06-28 MED ORDER — BENZOCAINE-MENTHOL 20-0.5 % EX AERO
1.0000 "application " | INHALATION_SPRAY | CUTANEOUS | Status: DC | PRN
  Administered 2013-06-28: 1 via TOPICAL
  Filled 2013-06-28 (×2): qty 56

## 2013-06-28 MED ORDER — DIBUCAINE 1 % RE OINT
1.0000 "application " | TOPICAL_OINTMENT | RECTAL | Status: DC | PRN
  Filled 2013-06-28: qty 28

## 2013-06-28 MED ORDER — EPHEDRINE 5 MG/ML INJ
10.0000 mg | INTRAVENOUS | Status: DC | PRN
  Filled 2013-06-28: qty 4

## 2013-06-28 MED ORDER — ONDANSETRON HCL 4 MG/2ML IJ SOLN: 4.0000 mg | INTRAMUSCULAR | Status: DC | PRN

## 2013-06-28 MED ORDER — TETANUS-DIPHTH-ACELL PERTUSSIS 5-2.5-18.5 LF-MCG/0.5 IM SUSP: 0.5000 mL | Freq: Once | INTRAMUSCULAR | Status: DC

## 2013-06-28 MED ORDER — LACTATED RINGERS IV SOLN
500.0000 mL | Freq: Once | INTRAVENOUS | Status: AC
  Administered 2013-06-28: 500 mL via INTRAVENOUS

## 2013-06-28 MED ORDER — ACETAMINOPHEN 325 MG PO TABS: 650.0000 mg | ORAL_TABLET | ORAL | Status: DC | PRN

## 2013-06-28 MED ORDER — ZOLPIDEM TARTRATE 5 MG PO TABS: 5.0000 mg | ORAL_TABLET | Freq: Every evening | ORAL | Status: DC | PRN

## 2013-06-28 MED ORDER — CITRIC ACID-SODIUM CITRATE 334-500 MG/5ML PO SOLN: 30.0000 mL | ORAL | Status: DC | PRN

## 2013-06-28 MED ORDER — DIPHENHYDRAMINE HCL 25 MG PO CAPS: 25.0000 mg | ORAL_CAPSULE | Freq: Four times a day (QID) | ORAL | Status: DC | PRN

## 2013-06-28 MED ORDER — MISOPROSTOL 200 MCG PO TABS
ORAL_TABLET | ORAL | Status: AC
  Filled 2013-06-28: qty 4

## 2013-06-28 MED ORDER — FENTANYL 2.5 MCG/ML BUPIVACAINE 1/10 % EPIDURAL INFUSION (WH - ANES)
14.0000 mL/h | INTRAMUSCULAR | Status: DC | PRN
  Administered 2013-06-28: 14 mL/h via EPIDURAL
  Filled 2013-06-28: qty 125

## 2013-06-28 MED ORDER — ONDANSETRON HCL 4 MG/2ML IJ SOLN: 4.0000 mg | Freq: Four times a day (QID) | INTRAMUSCULAR | Status: DC | PRN

## 2013-06-28 MED ORDER — IBUPROFEN 600 MG PO TABS: 600.0000 mg | ORAL_TABLET | Freq: Four times a day (QID) | ORAL | Status: DC | PRN

## 2013-06-28 MED ORDER — FLEET ENEMA 7-19 GM/118ML RE ENEM: 1.0000 | ENEMA | RECTAL | Status: DC | PRN

## 2013-06-28 MED ORDER — IBUPROFEN 600 MG PO TABS
600.0000 mg | ORAL_TABLET | Freq: Four times a day (QID) | ORAL | Status: DC
  Administered 2013-06-28 – 2013-06-29 (×4): 600 mg via ORAL
  Filled 2013-06-28 (×4): qty 1

## 2013-06-28 MED ORDER — DIPHENHYDRAMINE HCL 50 MG/ML IJ SOLN: 12.5000 mg | INTRAMUSCULAR | Status: DC | PRN

## 2013-06-28 MED ORDER — LACTATED RINGERS IV SOLN
INTRAVENOUS | Status: DC
  Administered 2013-06-28: 08:00:00 via INTRAVENOUS

## 2013-06-28 MED ORDER — LACTATED RINGERS IV SOLN
500.0000 mL | INTRAVENOUS | Status: DC | PRN
  Administered 2013-06-28: 1000 mL via INTRAVENOUS

## 2013-06-28 MED ORDER — PHENYLEPHRINE 40 MCG/ML (10ML) SYRINGE FOR IV PUSH (FOR BLOOD PRESSURE SUPPORT)
80.0000 ug | PREFILLED_SYRINGE | INTRAVENOUS | Status: DC | PRN
  Filled 2013-06-28: qty 5

## 2013-06-28 MED ORDER — LANOLIN HYDROUS EX OINT: TOPICAL_OINTMENT | CUTANEOUS | Status: DC | PRN

## 2013-06-28 MED ORDER — LIDOCAINE HCL (PF) 1 % IJ SOLN
INTRAMUSCULAR | Status: AC
  Filled 2013-06-28: qty 30

## 2013-06-28 MED ORDER — OXYCODONE-ACETAMINOPHEN 5-325 MG PO TABS
1.0000 | ORAL_TABLET | ORAL | Status: DC | PRN
  Administered 2013-06-28: 2 via ORAL
  Filled 2013-06-28: qty 2

## 2013-06-28 MED ORDER — ONDANSETRON HCL 4 MG PO TABS: 4.0000 mg | ORAL_TABLET | ORAL | Status: DC | PRN

## 2013-06-28 MED ORDER — OXYCODONE-ACETAMINOPHEN 5-325 MG PO TABS
1.0000 | ORAL_TABLET | ORAL | Status: DC | PRN
  Administered 2013-06-28: 1 via ORAL
  Administered 2013-06-28: 2 via ORAL
  Filled 2013-06-28: qty 2
  Filled 2013-06-28: qty 1

## 2013-06-28 MED ORDER — PHENYLEPHRINE 40 MCG/ML (10ML) SYRINGE FOR IV PUSH (FOR BLOOD PRESSURE SUPPORT)
80.0000 ug | PREFILLED_SYRINGE | INTRAVENOUS | Status: DC | PRN
  Administered 2013-06-28: 80 ug via INTRAVENOUS

## 2013-06-28 MED ORDER — LIDOCAINE HCL (PF) 1 % IJ SOLN
INTRAMUSCULAR | Status: DC | PRN
  Administered 2013-06-28 (×2): 5 mL

## 2013-06-28 MED ORDER — SIMETHICONE 80 MG PO CHEW: 80.0000 mg | CHEWABLE_TABLET | ORAL | Status: DC | PRN

## 2013-06-28 MED ORDER — PRENATAL MULTIVITAMIN CH
1.0000 | ORAL_TABLET | Freq: Every day | ORAL | Status: DC
  Administered 2013-06-28: 1 via ORAL
  Filled 2013-06-28: qty 1

## 2013-06-28 MED ORDER — LIDOCAINE HCL (PF) 1 % IJ SOLN: 30.0000 mL | INTRAMUSCULAR | Status: DC | PRN

## 2013-06-28 MED ORDER — BISACODYL 10 MG RE SUPP
10.0000 mg | Freq: Every day | RECTAL | Status: DC | PRN
  Filled 2013-06-28: qty 1

## 2013-06-28 MED ORDER — EPHEDRINE 5 MG/ML INJ: 10.0000 mg | INTRAVENOUS | Status: DC | PRN

## 2013-06-28 MED ORDER — WITCH HAZEL-GLYCERIN EX PADS: 1.0000 "application " | MEDICATED_PAD | CUTANEOUS | Status: DC | PRN

## 2013-06-28 MED ORDER — SENNOSIDES-DOCUSATE SODIUM 8.6-50 MG PO TABS
2.0000 | ORAL_TABLET | ORAL | Status: DC
  Administered 2013-06-28: 2 via ORAL
  Filled 2013-06-28: qty 2

## 2013-06-28 NOTE — Anesthesia Postprocedure Evaluation (Signed)
  Anesthesia Post-op Note  Patient: Leslie Villarreal  Procedure(s) Performed: * No procedures listed *  Patient Location: PACU and Mother/Baby  Anesthesia Type:Epidural  Level of Consciousness: awake, alert , oriented and patient cooperative  Airway and Oxygen Therapy: Patient Spontanous Breathing  Post-op Pain: none  Post-op Assessment: Post-op Vital signs reviewed, Patient's Cardiovascular Status Stable and Respiratory Function Stable  Post-op Vital Signs: Reviewed and stable  Complications: No apparent anesthesia complications

## 2013-06-28 NOTE — MAU Note (Signed)
Pt states have CTX since 4AM. Pt reports having bleeding and possible ROM about 0600 today. Good fetal movement.

## 2013-06-28 NOTE — H&P (Signed)
Leslie Villarreal is a 27 y.o. female G2P1001 with IUP at [redacted]w[redacted]d presenting for labor.   States has been having contractions since 3am. Got stronger around 6am.  SROM at 7am so decided to come in. No VB. +FM.    [facility] since 2nd trimester. No complications. Normal anatomy. GBS neg.     Prenatal History/Complications:  Past Medical History: Past Medical History  Diagnosis Date  . Abnormal Pap smear     Past Surgical History: Past Surgical History  Procedure Laterality Date  . Leep      Obstetrical History: OB History   Grav Para Term Preterm Abortions TAB SAB Ect Mult Living   2 1 1  0 0 0 0 0 0 1    G1- NSVD, 7lb G2- current  Social History: History   Social History  . Marital Status: Married    Spouse Name: N/A    Number of Children: N/A  . Years of Education: N/A   Social History Main Topics  . Smoking status: Never Smoker   . Smokeless tobacco: Never Used  . Alcohol Use: No  . Drug Use: No  . Sexual Activity: Not Currently    Birth Control/ Protection: None   Other Topics Concern  . None   Social History Narrative  . None    Family History: Family History  Problem Relation Age of Onset  . Diabetes Paternal Grandfather   . Diabetes Paternal Grandmother   . Diabetes Father     patients father    Allergies: No Known Allergies  Prescriptions prior to admission  Medication Sig Dispense Refill  . Prenatal Vit-Fe Fumarate-FA (PRENATAL COMPLETE) 14-0.4 MG TABS Take 1 tablet by mouth 2 (two) times daily.  60 each  2     Review of Systems   Constitutional: Negative for fever, chills, weight loss, malaise/fatigue and diaphoresis.  HENT: Negative for hearing loss, ear pain, nosebleeds, congestion, sore throat, neck pain, tinnitus and ear discharge.   Eyes: Negative for blurred vision, double vision, photophobia, pain, discharge and redness.  Respiratory: Negative for cough, hemoptysis, sputum production, shortness of  breath, wheezing and stridor.   Cardiovascular: Negative for chest pain, palpitations, orthopnea,  leg swelling  Gastrointestinal: Positive for abdominal pain. Negative for heartburn, nausea, vomiting, diarrhea, constipation, blood in stool Genitourinary: Negative for dysuria, urgency, frequency, hematuria and flank pain.  Musculoskeletal: Negative for myalgias, back pain, joint pain and falls.  Skin: Negative for itching and rash.  Neurological: Negative for dizziness, tingling, tremors, sensory change, speech change, focal weakness, seizures, loss of consciousness, weakness and headaches.  Endo/Heme/Allergies: Negative for environmental allergies and polydipsia. Does not bruise/bleed easily.  Psychiatric/Behavioral: Negative for depression, suicidal ideas, hallucinations, memory loss and substance abuse. The patient is not nervous/anxious and does not have insomnia.       Blood pressure 132/71, pulse 99, temperature 98 F (36.7 C), temperature source Oral, resp. rate 18, height 5\' 4"  (1.626 m), weight 73.936 kg (163 lb), last menstrual period 09/20/2012, SpO2 100.00%. General appearance: alert, cooperative and no distress Lungs: clear to auscultation bilaterally Heart: regular rate and rhythm Abdomen: soft, non-tender; bowel sounds normal Pelvic: 8/100/0 per nurse  Extremities: Homans sign is negative, no sign of DVT Presentation: cephalic per nursing check Fetal monitoringBaseline: 140 bpm, Variability: Good {> 6 bpm), Accelerations: Reactive and Decelerations: occasional variables Uterine activity contractions every 2-22min Dilation: 10 Effacement (%): 100 (LEEP scar tissue noted on SVE) Station: +1 Exam by:: Leslie Coe RN  Prenatal labs: ABO, Rh: O/POS/-- (04/09 2956) Antibody: NEG (04/09 0938) Rubella:   RPR: NON REAC (08/06 1246)  HBsAg: NEGATIVE (04/09 2130)  HIV: NON REACTIVE (08/06 1246)  GBS: Negative (09/17 0000)  1 hr Glucola 121 Genetic screening  Too late Anatomy US  normal    Assessment: Leslie Villarreal is a 27 y.o. G2P1001 with an IUP at [redacted]w[redacted]d by LMP presenting for active labor.   Plan: 1) admit to L&D - routine orders - desires epidural. Ordered  2) labor - cont to monitor  3) FWB - cat I tracing - GBS neg - EFW 8Lb.    4) anticipate NSVD   Jarrius Huaracha L, MD 06/28/2013, 10:27 AM

## 2013-06-28 NOTE — Anesthesia Preprocedure Evaluation (Signed)

## 2013-06-28 NOTE — Lactation Note (Signed)
This note was copied from the chart of Leslie Villarreal. Lactation Consultation Note  Patient Name: Leslie Villarreal [id number] Date: 06/28/2013 Reason for consult: Initial assessment of this second-time mother and her newborn at 97 hours of age.  FOB present and states they both speak and understand [place].  LC reviews hand expression technique and mom has soft breasts with easily expressible colostrum.  Mom states that she nursed her first child (now 59 yo)  for one month but stopped because she thought she wasn't producing enough milk.  This baby had initial latch score of "8" per RN assessment and she nursed for 20 minutes.  Baby has nursed three times since birth and pp RN also assigned a LATCH score of "8". LC provided [employer] brochure in both [place] and [place], and reviewed [facility] services and list of community and web site resources. LC also reviewed importance of STS and cue feeding, demonstrated small newborn stomach size (belly balls) and encouraged parents to review: Baby and Me pp 14 and 20-25 for STS and BF information.    Maternal Data Formula Feeding for Exclusion: No Infant to breast within first hour of birth: Yes (initial LATCH score=8) Has patient been taught Hand Expression?: Yes (LC demonstrated and large drops of milk expressed) Does the patient have breastfeeding experience prior to this delivery?: Yes  Feeding Feeding Type: Breast Fed  LATCH Score/Interventions Latch: Repeated attempts needed to sustain latch, nipple held in mouth throughout feeding, stimulation needed to elicit sucking reflex.  Audible Swallowing: A few with stimulation  Type of Nipple: Everted at rest and after stimulation  Comfort (Breast/Nipple): Soft / non-tender     Hold (Positioning): No assistance needed to correctly position infant at breast.  LATCH Score: 8 (previous assessment by RN)  Lactation Tools Discussed/Used   STS, cue feedings Hand expression Small newborn  stomach size Supply and demand   Consult Status Consult Status: Follow-up Date: 06/29/13 Follow-up type: In-patient    Warrick Parisian [facility] 06/28/2013, 7:53 PM

## 2013-06-28 NOTE — Anesthesia Procedure Notes (Signed)
Epidural Patient location during procedure: OB Start time: 06/28/2013 8:40 AM  Staffing Anesthesiologist: Angus Seller., Harrell Gave. Performed by: anesthesiologist   Preanesthetic Checklist Completed: patient identified, site marked, surgical consent, pre-op evaluation, timeout performed, IV checked, risks and benefits discussed and monitors and equipment checked  Epidural Patient position: sitting Prep: site prepped and draped and DuraPrep Patient monitoring: continuous pulse ox and blood pressure Approach: midline Injection technique: LOR air  Needle:  Needle type: Tuohy  Needle gauge: 17 G Needle length: 9 cm and 9 Needle insertion depth: 5 cm cm Catheter type: closed end flexible Catheter size: 19 Gauge Catheter at skin depth: 10 cm Test dose: negative  Assessment Events: blood not aspirated, injection not painful, no injection resistance, negative IV test and no paresthesia  Additional Notes Patient identified.  Risk benefits discussed including failed block, incomplete pain control, headache, nerve damage, paralysis, blood pressure changes, nausea, vomiting, reactions to medication both toxic or allergic, and postpartum back pain.  Patient expressed understanding and wished to proceed.  All questions were answered.  Sterile technique used throughout procedure and epidural site dressed with sterile barrier dressing. No paresthesia or other complications noted.The patient did not experience any signs of intravascular injection such as tinnitus or metallic taste in mouth nor signs of intrathecal spread such as rapid motor block. Please see nursing notes for vital signs.

## 2013-06-29 LAB — CBC
HCT: 29.8 % — ABNORMAL LOW (ref 36.0–46.0)
MCH: 27.6 pg (ref 26.0–34.0)
MCHC: 33.9 g/dL (ref 30.0–36.0)
MCV: 81.4 fL (ref 78.0–100.0)
RBC: 3.66 MIL/uL — ABNORMAL LOW (ref 3.87–5.11)
RDW: 14.1 % (ref 11.5–15.5)
WBC: 9.6 10*3/uL (ref 4.0–10.5)

## 2013-06-29 MED ORDER — IBUPROFEN 600 MG PO TABS: 600.0000 mg | ORAL_TABLET | Freq: Four times a day (QID) | ORAL | Status: DC

## 2013-06-29 NOTE — Discharge Summary (Signed)
Visit conducted with assistance of Spanish Interpreter Eda Royal  Obstetric Discharge Summary Reason for Admission: onset of labor and rupture of membranes Prenatal Procedures: NST Intrapartum Procedures: spontaneous vaginal delivery and manual removal of placenta Postpartum Procedures: none Complications-Operative and Postpartum: 1st degree labial tear requiring repair due to bleeding, retained placenta requiring manual removal Hemoglobin  Date Value Range Status  06/29/2013 10.1* 12.0 - 15.0 g/dL Final     REPEATED TO VERIFY     DELTA CHECK NOTED     HCT  Date Value Range Status  06/29/2013 29.8* 36.0 - 46.0 % Final    Physical Exam:  General: alert, cooperative and no distress Lochia: appropriate Uterine Fundus: firm Incision: n/a DVT Evaluation: No evidence of DVT seen on physical exam.  Discharge Diagnoses: Term Pregnancy-delivered  Discharge Information: Date: 06/29/2013 Activity: pelvic rest Diet: routine Medications: Ibuprofen Condition: stable Instructions: refer to practice specific booklet Discharge to: home Follow-up Information   Follow up with [facility]. Schedule an appointment as soon as possible for a visit in 4 weeks. (postpartum visit and Nexplanon insertion)    Specialty:  Obstetrics and Gynecology   Contact information:   [address] [place] [address] [phone]      Newborn Data: Live born female  Birth Weight: 8 lb 4.8 oz (3765 g) APGAR: 9, 9  Home with mother.  Hospital course: Pt was admitted with term active labor and SROM. She is GBS negative and did not require antibiotics. She progressed well and had a NSVD. Delivery was complicated by 1st degree labial tear requiring requiring routine repair due to bleeding and retained placenta >30 minutes requiring manual removal. Initially pt had boggy fundus which responded to bimanual uterine massage. She received Methergine 0.2 mg IM prophylactically. Her postpartum  course was otherwise uncomplicated. She is O positive and did not require Rhogam. She is breastfeeding. She desires Nexplanon for contraception. She will follow up at [facility] for her postpartum visit.   [place], Ryann 06/29/2013, 9:14 AM  Evaluation and management procedures were performed by Resident physician under my supervision/collaboration. Chart reviewed, patient examined by me and I agree with management and plan.

## 2013-06-29 NOTE — Lactation Note (Signed)
This note was copied from the chart of Leslie Villarreal. Lactation Consultation Note  Patient Name: Leslie Villarreal RUEAV'W Date: 06/29/2013 Reason for consult: Follow-up assessment Assisted Mom with obtaining more depth with latching her baby. Reviewed positioning and importance of deep latch. BF basics reviewed with parents. Parents report understanding English, declined interpreter. Advised to BF with feeding ques, cluster feeding discussed. Engorgement care reviewed. Advised of OP services and support group. Advised to call if would like further assist before d/c today.   Maternal Data    Feeding Feeding Type: Breast Fed Length of feed: 15 min  LATCH Score/Interventions Latch: Grasps breast easily, tongue down, lips flanged, rhythmical sucking.  Audible Swallowing: A few with stimulation  Type of Nipple: Everted at rest and after stimulation  Comfort (Breast/Nipple): Soft / non-tender     Hold (Positioning): Assistance needed to correctly position infant at breast and maintain latch. Intervention(s): Breastfeeding basics reviewed;Support Pillows;Position options;Skin to skin  LATCH Score: 8  Lactation Tools Discussed/Used     Consult Status Consult Status: Complete Date: 06/29/13 Follow-up type: In-patient    Alfred Levins 06/29/2013, 11:02 AM

## 2013-07-04 NOTE — H&P (Signed)
Attestation of Attending Supervision of Fellow: Evaluation and management procedures were performed by the Fellow under my supervision and collaboration.  I have reviewed the Fellow's note and chart, and I agree with the management and plan.    

## 2013-08-01 ENCOUNTER — Ambulatory Visit (INDEPENDENT_AMBULATORY_CARE_PROVIDER_SITE_OTHER): Payer: Medicaid Other | Admitting: Family Medicine

## 2013-08-01 VITALS — BP 108/66 | HR 64 | Temp 97.5°F | Wt 143.5 lb

## 2013-08-01 DIAGNOSIS — D069 Carcinoma in situ of cervix, unspecified: Secondary | ICD-10-CM

## 2013-08-01 NOTE — Progress Notes (Signed)
Patient ID: Leslie Villarreal, female   DOB: 1985/12/25, 27 y.o.   MRN: [id number] Subjective:     Leslie Villarreal is a 27 y.o. female who presents for a postpartum visit. She is 4 weeks postpartum following a spontaneous vaginal delivery. I have fully reviewed the prenatal and intrapartum course. The delivery was at 41 gestational weeks. Outcome: spontaneous vaginal delivery. Anesthesia: epidural. Postpartum course has been uncomplicated. Baby's course has been complicated with mild bilirubin. Baby is feeding by breast. Bleeding thin lochia. Bowel function is normal. Bladder function is normal. Patient is not sexually active. Contraception method is IUD. Postpartum depression screening: negative.  The following portions of the patient's history were reviewed and updated as appropriate: allergies, current medications, past family history, past medical history, past social history, past surgical history and problem list.  Using three pads per day 2/2 bleeding.  Review of Systems Pertinent items are noted in HPI.   Objective:    BP 108/66  Pulse 64  Temp(Src) 97.5 F (36.4 C)  Wt 143 lb 8 oz (65.091 kg)  Breastfeeding? Yes  General:  alert, cooperative and appears stated age   Breasts:    Lungs: clear to auscultation bilaterally and normal percussion bilaterally  Heart:  regular rate and rhythm, S1, S2 normal, no murmur, click, rub or gallop and normal apical impulse  Abdomen: soft, non-tender; bowel sounds normal; no masses,  no organomegaly   Vulva:  normal  Vagina: normal vagina, well healed tear. Suture still in place. Mildly tender to palpation        Assessment:     doing well  postpartum exam. Pap smear not done at today's visit.  PP bleeding normal but not improved recommend return in 4 weeks for reevaluation. Plan:    1. Contraception: currently does not have insurance and will recommend applying for mirena scholarship. 2. May return to intercourse as needed with  caution 3. Follow up in: 4 week if no improvement or as needed.   Tawana Scale 08/01/2013 3:05 PM

## 2013-08-01 NOTE — Patient Instructions (Signed)
Please return if approved for mirena scholarship or go to HD for long acting birth control.

## 2013-08-06 IMAGING — US US OB TRANSVAGINAL
1 series · 13 of 28 positions shown · non-contrast
Comparison: None.

CLINICAL DATA: Vaginal bleeding during pregnancy.  Estimated
gestational age by LMP is 5 weeks 5 days.  Quantitative beta HCG is
3116.

OBSTETRIC <14 WK US AND TRANSVAGINAL OB US
TECHNIQUE: Both transabdominal and transvaginal ultrasound
examinations were performed for complete evaluation of the
gestation as well as the maternal uterus, adnexal regions, and
pelvic cul-de-sac.  Transvaginal technique was performed to assess
early pregnancy.

[Series 1: us ob transvaginal · 0.30mm/px · 13 of 84 slices shown]
[im 4/84]
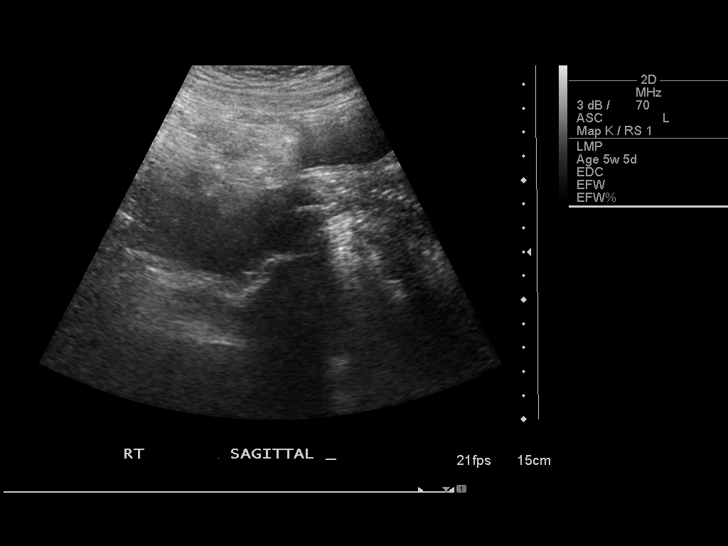
[im 10/84]
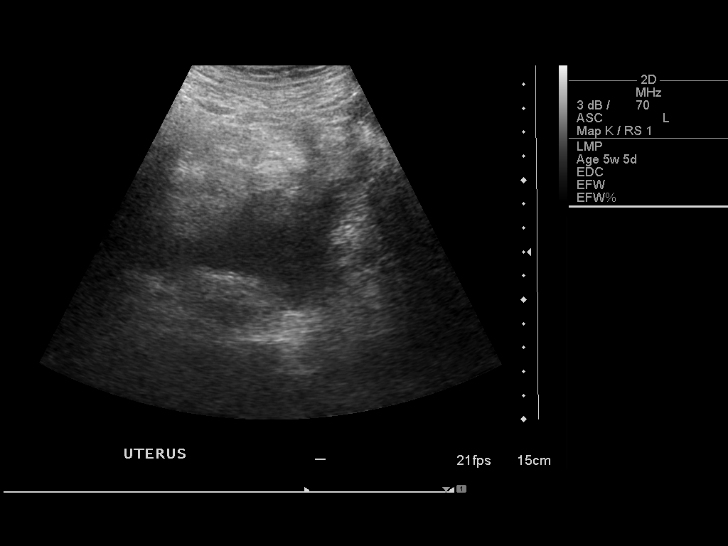
[im 16/84]
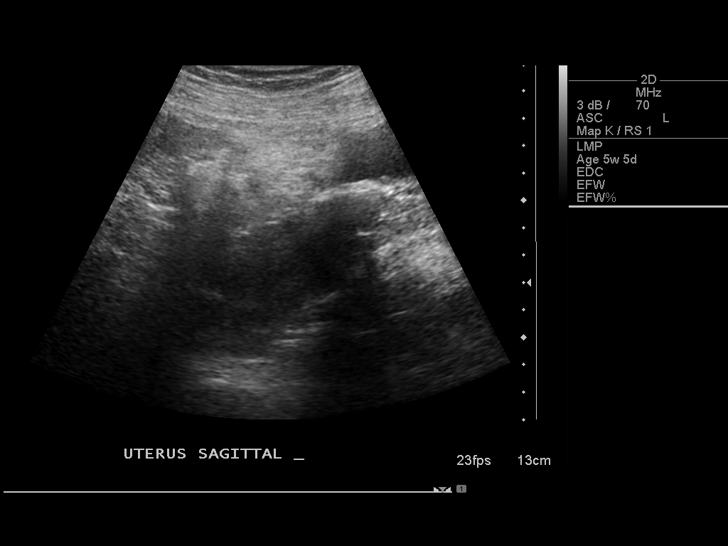
[im 22/84]
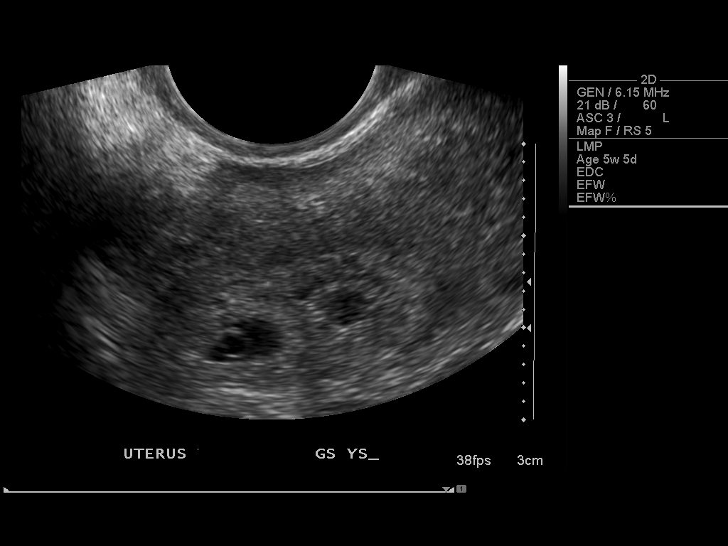
[im 28/84]
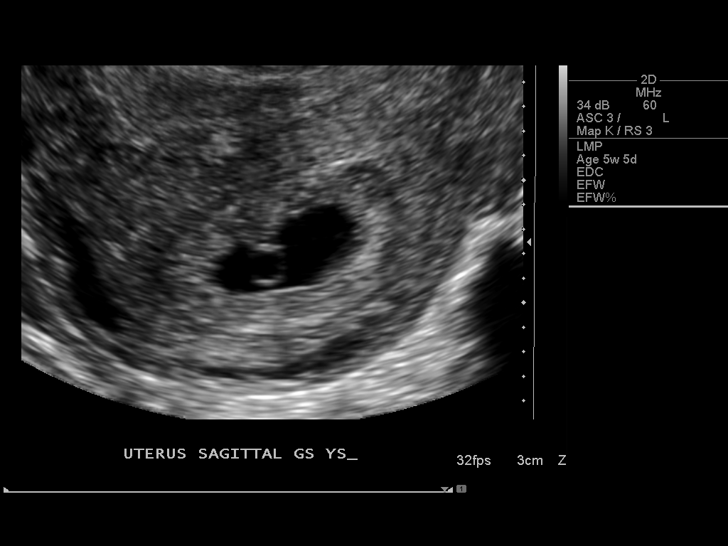
[im 34/84]
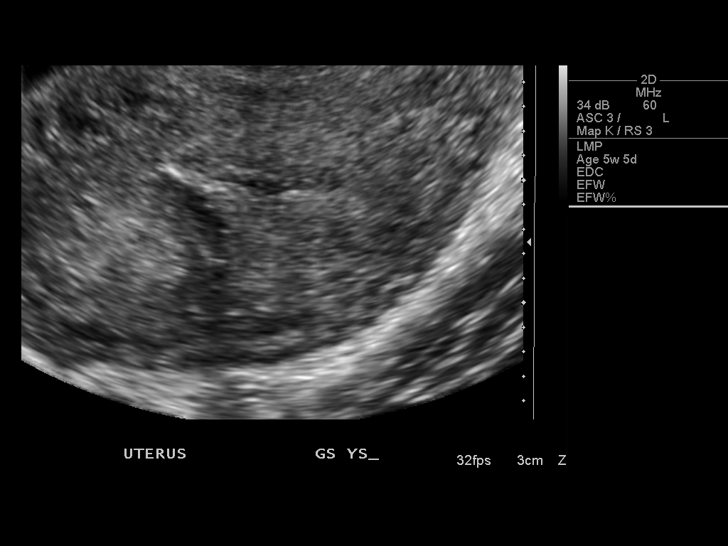
[im 44/84]
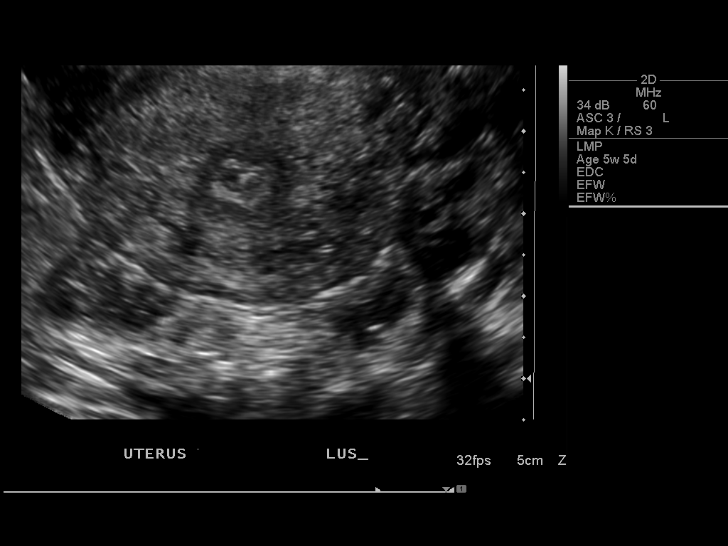
[im 50/84]
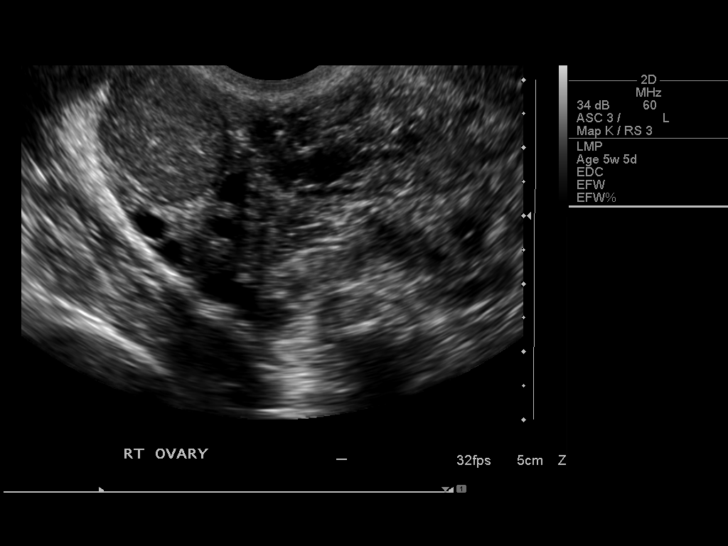
[im 56/84]
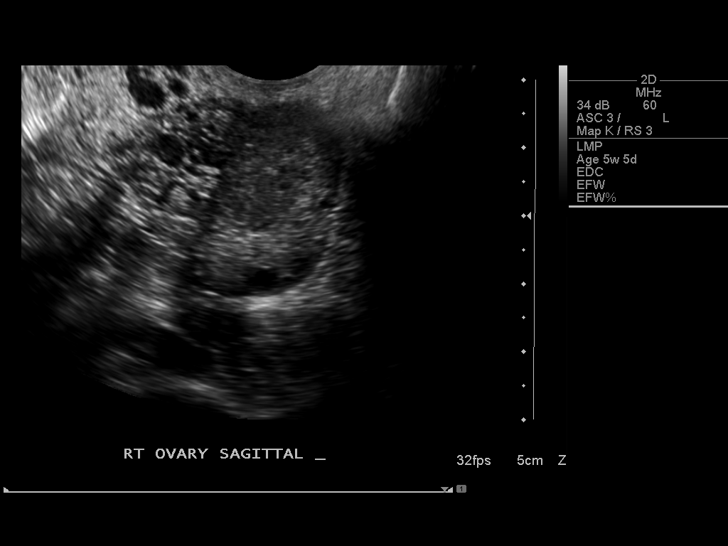
[im 62/84]
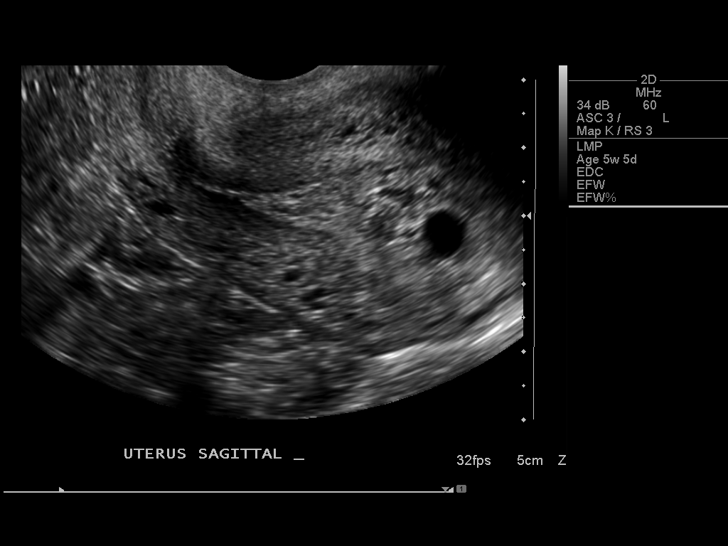
[im 68/84]
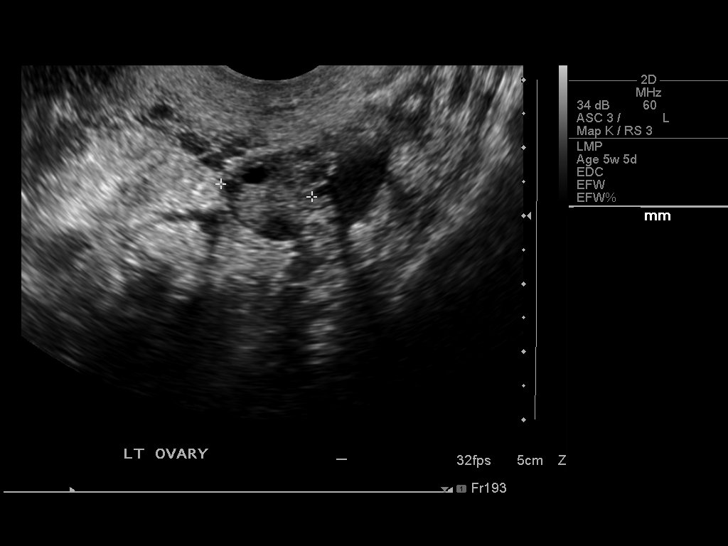
[im 74/84]
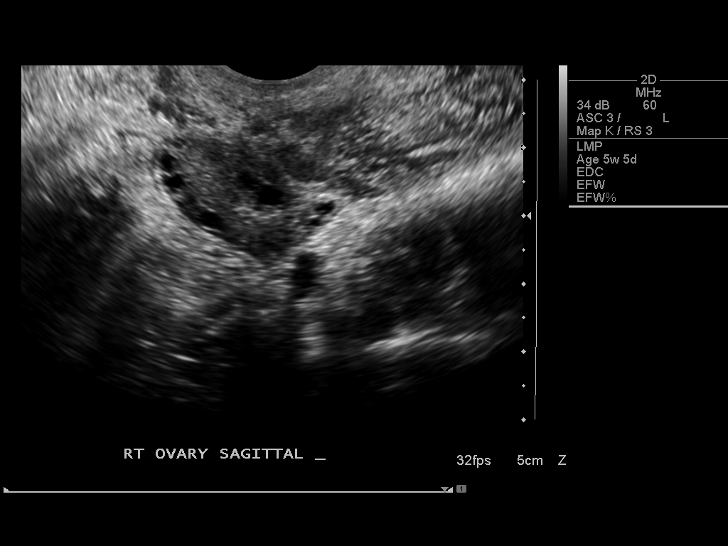
[im 80/84]
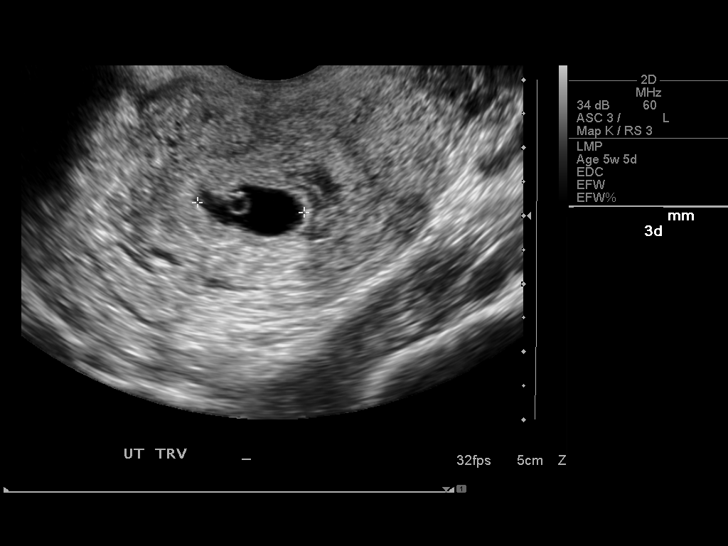

[13 of 28 positions shown; findings below may reference images not displayed]

Intrauterine gestational sac:  A single intrauterine gestational
sac is present with regular contours.  Small hypoechoic collection
external to the gestational sac inferiorly consistent with small
subchorionic hemorrhage.
Yolk sac: The yolk sac is visualized.
Embryo: Fetal pole is not identified.
Cardiac Activity: Fetal cardiac activity is not identified.

MSD: 15.8 mm  6 w 3 d         US EDC: 06/22/2013

Maternal uterus/adnexae:
The uterus is retroverted.  No myometrial masses.  The right ovary
measures 1.9 x 3.4 x 2.2 cm.  Normal follicular changes.
Suggestion of complex cyst measuring about 15 mm diameter.  This
may represent corpus luteum cyst.  The left ovary measures 1.4 x
2.8 x 1.6 cm.  Normal follicular changes.  No abnormal adnexal
masses.  No free pelvic fluid collections.
IMPRESSION: Single intrauterine gestational sac and yolk sac identified.
Estimated gestational age by mean sac diameter is 6 weeks 3 days.
Probable early intrauterine gestational sac, but no fetal pole, or
cardiac activity yet visualized.  Recommend follow-up quantitative
B-HCG levels and follow-up US in 14 days to confirm and assess
viability. This recommendation follows SRU consensus guidelines:
Diagnostic Criteria for Nonviable Pregnancy Early in the First
Trimester.  N Engl J Med 9248; [DATE].

## 2013-10-31 IMAGING — US US OB COMP +14 WK
1 series · 12 of 28 positions shown · non-contrast
Comparison: none

[Series 1: us ob comp +14 wk · 12 of 77 slices shown]
[im 3/77]
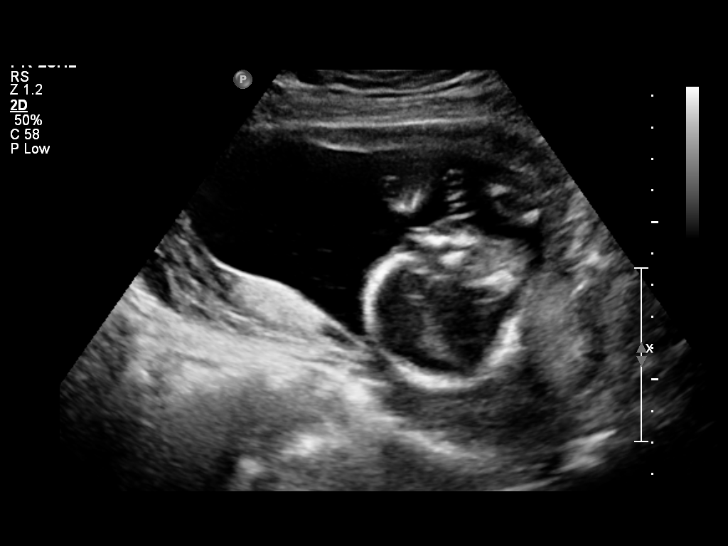
[im 9/77]
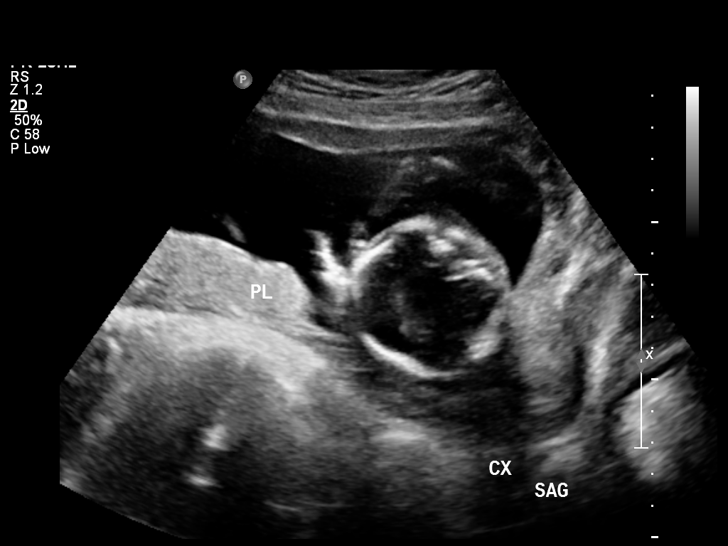
[im 15/77]
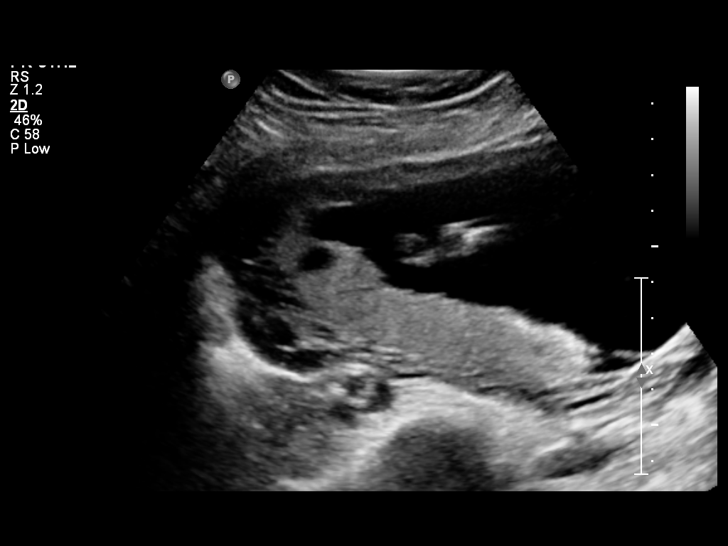
[im 23/77]
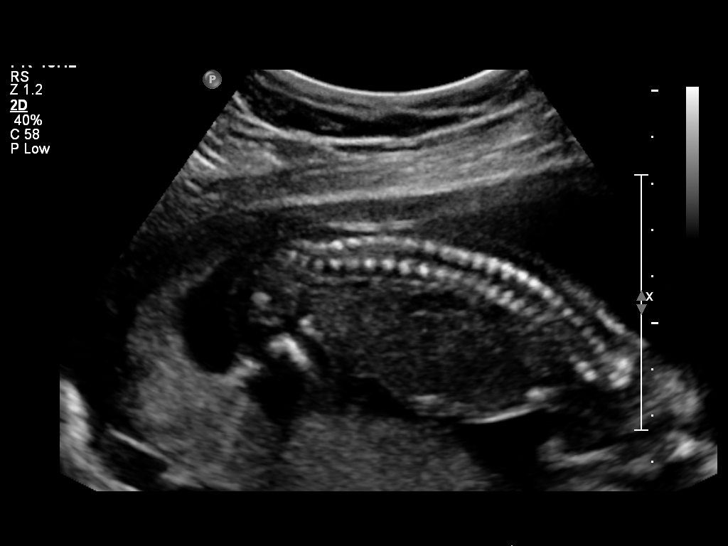
[im 29/77]
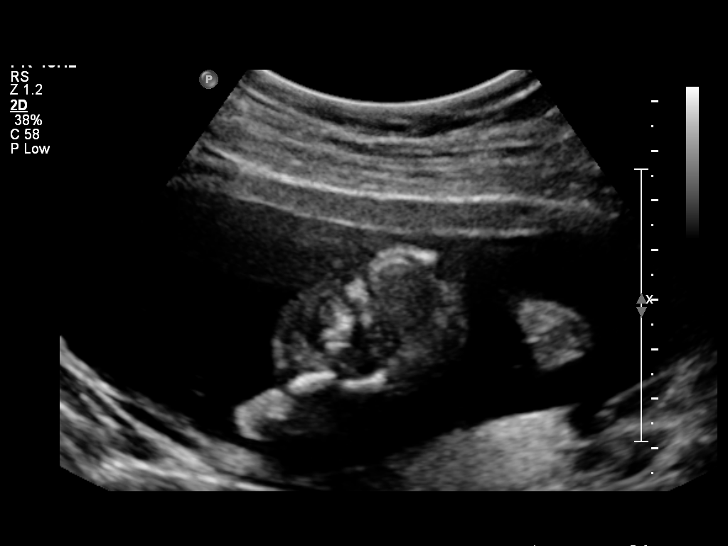
[im 34/77]
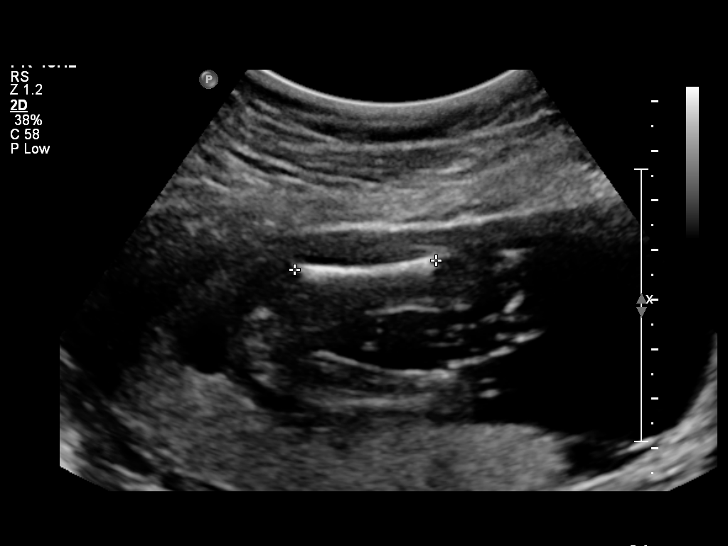
[im 43/77]
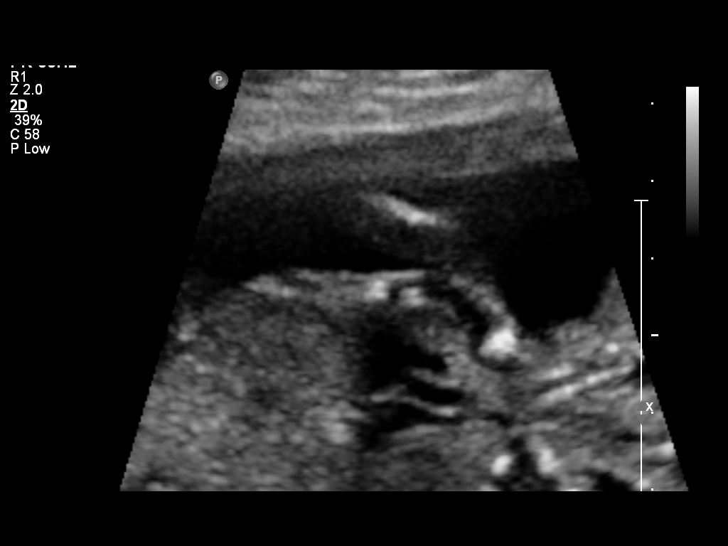
[im 48/77]
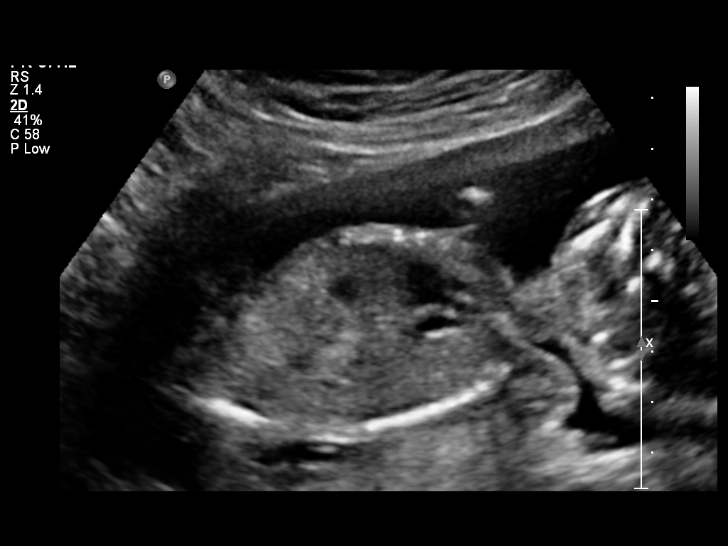
[im 54/77]
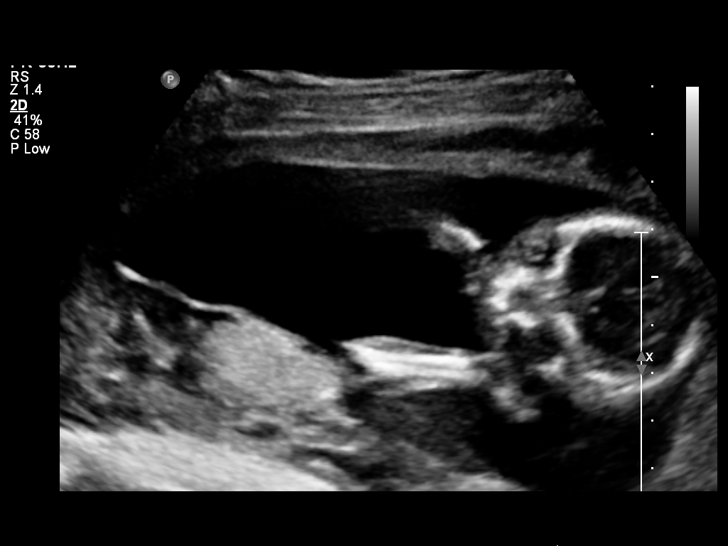
[im 62/77]
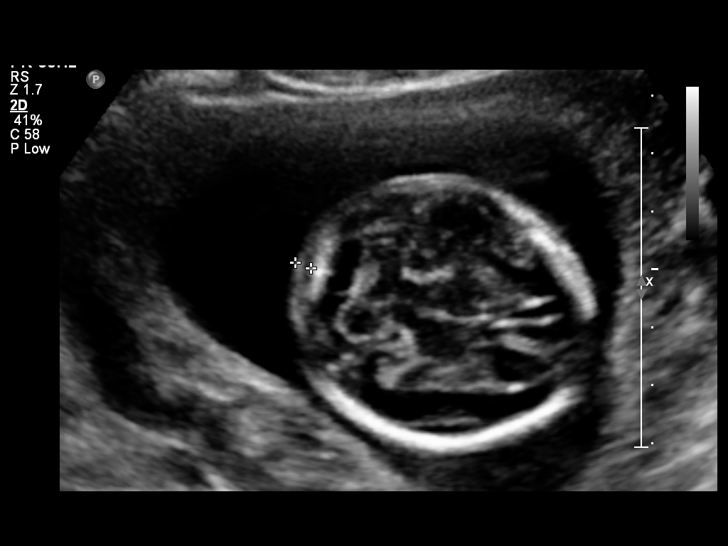
[im 68/77]
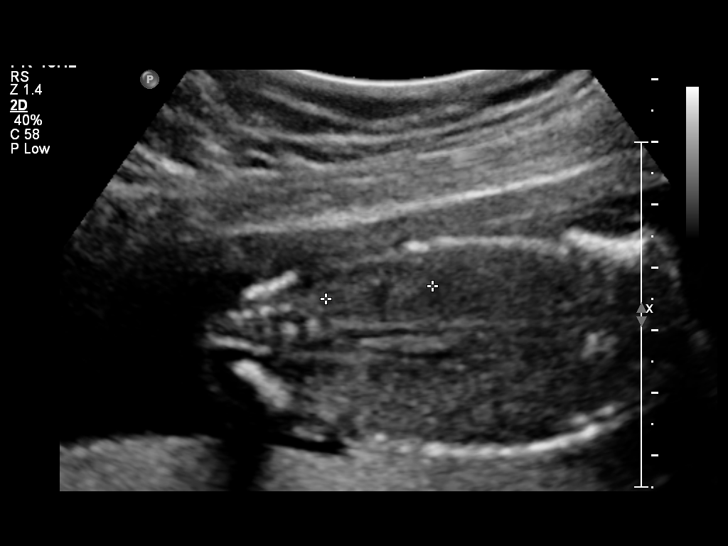
[im 74/77]
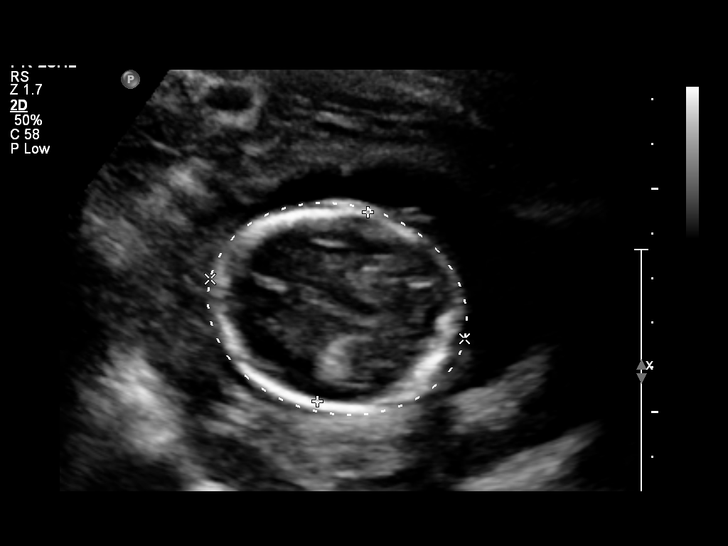

[12 of 28 positions shown; findings below may reference images not displayed]

OBSTETRICS REPORT
                      (Signed Final 01/24/2013 [DATE])

Service(s) Provided

 US OB COMP + 14 WK                                    76805.1
Indications

 Basic anatomic survey
Fetal Evaluation

 Num Of Fetuses:    1
 Fetal Heart Rate:  140                         bpm
 Cardiac Activity:  Observed
 Presentation:      Cephalic
 Placenta:          Posterior, above cervical
                    os
 P. Cord            Visualized
 Insertion:

 Amniotic Fluid
 AFI FV:      Subjectively within normal limits
                                             Larg Pckt:     5.1  cm
Biometry

 BPD:     43.7  mm    G. Age:   19w 1d                CI:        70.52   70 - 86
                                                      FL/HC:      17.1   15.8 -
                                                                         18
 HC:     165.9  mm    G. Age:   19w 2d       81  %    HC/AC:      1.33   1.07 -

 AC:       125  mm    G. Age:   18w 1d       37  %    FL/BPD:
 FL:      28.4  mm    G. Age:   18w 5d       54  %    FL/AC:      22.7   20 - 24
 NFT:      2.9  mm

 Est. FW:     243  gm      0 lb 9 oz     48  %
Gestational Age

 LMP:           17w 1d       Date:   09/26/12                 EDD:   07/03/13
 U/S Today:     18w 6d                                        EDD:   06/21/13
 Best:          18w 3d    Det. By:   U/S C R L (12/05/12)     EDD:   06/24/13
2nd Trimester Genetic Sonogram - Trisomy 21 Screening

 Age:                                             28          Risk=1:   719

 Structural anomalies (inc. cardiac):             No
 Echogenic bowel:                                 No
 Hypoplastic / absent midphalanx 5th Digit:       N/A
 Wide space 6st-4nd toes:                         N/A
 Pyelectasis:                                     No
 2-vessel umbilical cord:                         No
 Echogenic cardiac foci:                          No
Anatomy

 Cranium:          Appears normal         Aortic Arch:      Basic anatomy
                                                            exam per order
 Fetal Cavum:      Appears normal         Ductal Arch:      Basic anatomy
                                                            exam per order
 Ventricles:       Appears normal         Diaphragm:        Appears normal
 Choroid Plexus:   Appears normal         Stomach:          Appears normal, left
                                                            sided
 Cerebellum:       Appears normal         Abdomen:          Appears normal
 Posterior Fossa:  Appears normal         Abdominal Wall:   Appears nml (cord
                                                            insert, abd wall)
 Nuchal Fold:      Appears normal         Cord Vessels:     Appears normal (3
                                                            vessel cord)
 Face:             Basic anatomy          Kidneys:          Appear normal
                   exam per order
 Lips:             Appears normal         Bladder:          Appears normal
 Heart:            Appears normal         Spine:            Appears normal
                   (4CH, axis, and
                   situs)
 RVOT:             Appears normal         Lower             Appears normal
                                          Extremities:
 LVOT:             Appears normal         Upper             Appears normal
                                          Extremities:

 Other:  Female gender. Technically difficult due to fetal position.
Cervix Uterus Adnexa

 Cervical Length:   3.3       cm

 Cervix:       Normal appearance by transabdominal scan.

 Left Ovary:   Within normal limits.
 Right Ovary:  Not visualized.
 Adnexa:     No abnormality visualized.
Impression

 Single living IUP with assigned GA of 18w 3d. Appropriate
 fetal growth.
 No fetal anatomic abnormality is identified.
 Normal amniotic fluid volume and cervical length.

## 2014-05-16 ENCOUNTER — Ambulatory Visit: Payer: Self-pay | Attending: Internal Medicine

## 2014-05-28 ENCOUNTER — Ambulatory Visit: Payer: Self-pay | Attending: Internal Medicine | Admitting: Internal Medicine

## 2014-05-28 ENCOUNTER — Encounter: Payer: Self-pay | Admitting: Internal Medicine

## 2014-05-28 VITALS — BP 115/65 | HR 81 | Temp 98.6°F | Resp 17 | Wt 124.0 lb

## 2014-05-28 DIAGNOSIS — Z139 Encounter for screening, unspecified: Secondary | ICD-10-CM

## 2014-05-28 DIAGNOSIS — Z833 Family history of diabetes mellitus: Secondary | ICD-10-CM | POA: Insufficient documentation

## 2014-05-28 DIAGNOSIS — Z87410 Personal history of cervical dysplasia: Secondary | ICD-10-CM | POA: Insufficient documentation

## 2014-05-28 DIAGNOSIS — Z862 Personal history of diseases of the blood and blood-forming organs and certain disorders involving the immune mechanism: Secondary | ICD-10-CM | POA: Insufficient documentation

## 2014-05-28 DIAGNOSIS — Z23 Encounter for immunization: Secondary | ICD-10-CM | POA: Insufficient documentation

## 2014-05-28 LAB — CBC WITH DIFFERENTIAL/PLATELET
BASOS PCT: 0 % (ref 0–1)
Basophils Absolute: 0 10*3/uL (ref 0.0–0.1)
EOS ABS: 0.2 10*3/uL (ref 0.0–0.7)
Eosinophils Relative: 2 % (ref 0–5)
HCT: 42 % (ref 36.0–46.0)
Hemoglobin: 13.6 g/dL (ref 12.0–15.0)
Lymphocytes Relative: 23 % (ref 12–46)
Lymphs Abs: 1.9 10*3/uL (ref 0.7–4.0)
MCH: 26.9 pg (ref 26.0–34.0)
MCHC: 32.4 g/dL (ref 30.0–36.0)
MCV: 83 fL (ref 78.0–100.0)
Monocytes Absolute: 0.6 10*3/uL (ref 0.1–1.0)
Monocytes Relative: 7 % (ref 3–12)
NEUTROS ABS: 5.6 10*3/uL (ref 1.7–7.7)
Neutrophils Relative %: 68 % (ref 43–77)
Platelets: 211 10*3/uL (ref 150–400)
RBC: 5.06 MIL/uL (ref 3.87–5.11)
RDW: 12.9 % (ref 11.5–15.5)
WBC: 8.2 10*3/uL (ref 4.0–10.5)

## 2014-05-28 NOTE — Progress Notes (Signed)
Patient Demographics  Seryna Marek, is a 28 y.o. female  ZOX:[id number]  [id number]  DOB - [birth year]  CC:  Chief Complaint  Patient presents with  . Establish Care       HPI: Khiley Lieser is a 28 y.o. female here today to establish medical care. Her patient has history of CIN and had a LEEP procedure done currently following up with GYN as per patient she had her last Pap smear done was in June and was normal, she also has IUD in place, patient reported to have family history of diabetes, her previous blood work reviewed noticed anemia, patient does not take any medications currently occasionally she noticed bleeding gums denies any fever chills sore throat chest and shortness of breath. Patient has No headache, No chest pain, No abdominal pain - No Nausea, No new weakness tingling or numbness, No Cough - SOB.  No Known Allergies Past Medical History  Diagnosis Date  . Abnormal Pap smear    Current Outpatient Prescriptions on File Prior to Visit  Medication Sig Dispense Refill  . ibuprofen (ADVIL,MOTRIN) 600 MG tablet Take 1 tablet (600 mg total) by mouth every 6 (six) hours.  30 tablet  0  . Prenatal Vit-Fe Fumarate-FA (PRENATAL MULTIVITAMIN) TABS tablet Take 1 tablet by mouth daily at 12 noon.       No current facility-administered medications on file prior to visit.   Family History  Problem Relation Age of Onset  . Diabetes Paternal Grandfather   . Diabetes Paternal Grandmother   . Diabetes Father     patients father  . Hypertension Maternal Grandmother   . Stroke Maternal Grandfather    History   Social History  . Marital Status: Married    Spouse Name: N/A    Number of Children: N/A  . Years of Education: N/A   Occupational History  . Not on file.   Social History Main Topics  . Smoking status: Never Smoker   . Smokeless tobacco: Never Used  . Alcohol Use: No  . Drug Use: No  . Sexual Activity: Not Currently    Birth Control/  Protection: None   Other Topics Concern  . Not on file   Social History Narrative  . No narrative on file    Review of Systems: Constitutional: Negative for fever, chills, diaphoresis, activity change, appetite change and fatigue. HENT: Negative for ear pain, nosebleeds, congestion, facial swelling, rhinorrhea, neck pain, neck stiffness and ear discharge.  Eyes: Negative for pain, discharge, redness, itching and visual disturbance. Respiratory: Negative for cough, choking, chest tightness, shortness of breath, wheezing and stridor.  Cardiovascular: Negative for chest pain, palpitations and leg swelling. Gastrointestinal: Negative for abdominal distention. Genitourinary: Negative for dysuria, urgency, frequency, hematuria, flank pain, decreased urine volume, difficulty urinating and dyspareunia.  Musculoskeletal: Negative for back pain, joint swelling, arthralgia and gait problem. Neurological: Negative for dizziness, tremors, seizures, syncope, facial asymmetry, speech difficulty, weakness, light-headedness, numbness and headaches.  Hematological: Negative for adenopathy. Does not bruise/bleed easily. Psychiatric/Behavioral: Negative for hallucinations, behavioral problems, confusion, dysphoric mood, decreased concentration and agitation.    Objective:   Filed Vitals:   05/28/14 1609  BP: 115/65  Pulse: 81  Temp: 98.6 F (37 C)  Resp: 17    Physical Exam: Constitutional: Patient appears well-developed and well-nourished. No distress. HENT: Normocephalic, atraumatic, External right and left ear normal. Oropharynx is clear and moist.  Eyes: Conjunctivae and EOM are normal. PERRLA, no scleral icterus. Neck: Normal ROM. Neck supple.  No JVD. No tracheal deviation. No thyromegaly. CVS: RRR, S1/S2 +, no murmurs, no gallops, no carotid bruit.  Pulmonary: Effort and breath sounds normal, no stridor, rhonchi, wheezes, rales.  Abdominal: Soft. BS +, no distension, tenderness, rebound or  guarding.  Musculoskeletal: Normal range of motion. No edema and no tenderness.  Neuro: Alert. Normal reflexes, muscle tone coordination. No cranial nerve deficit. Skin: Skin is warm and dry. No rash noted. Not diaphoretic. No erythema. No pallor. Psychiatric: Normal mood and affect. Behavior, judgment, thought content normal.  Lab Results  Component Value Date   WBC 9.6 06/29/2013   HGB 10.1* 06/29/2013   HCT 29.8* 06/29/2013   MCV 81.4 06/29/2013   PLT 137* 06/29/2013   Lab Results  Component Value Date   CREATININE 0.50 10/30/2012   BUN 9 10/30/2012   NA 133* 10/30/2012   K 3.3* 10/30/2012   CL 102 10/30/2012   CO2 22 10/30/2012    No results found for this basename: HGBA1C   Lipid Panel  No results found for this basename: chol, trig, hdl, cholhdl, vldl, ldlcalc       Assessment and plan:   1. History of anemia  - CBC with Differential  2. Screening Check baseline blood work - COMPLETE METABOLIC PANEL WITH GFR - TSH - Vit D  25 hydroxy (rtn osteoporosis monitoring)  3. Family history of diabetes mellitus (DM)  - Hemoglobin A1c   Health Maintenance  -Pap Smear: following up with GYN   -Influenza shot today   Return in about 6 months (around 11/26/2014), or if symptoms worsen or fail to improve.    Doris Cheadle, MD

## 2014-05-28 NOTE — Progress Notes (Signed)
Patient here to establish care Currently takes no prescribed medications 

## 2014-05-29 LAB — COMPLETE METABOLIC PANEL WITH GFR
ALBUMIN: 4.5 g/dL (ref 3.5–5.2)
ALT: 11 U/L (ref 0–35)
AST: 14 U/L (ref 0–37)
Alkaline Phosphatase: 81 U/L (ref 39–117)
BUN: 16 mg/dL (ref 6–23)
CHLORIDE: 105 meq/L (ref 96–112)
CO2: 25 mEq/L (ref 19–32)
CREATININE: 0.67 mg/dL (ref 0.50–1.10)
Calcium: 9 mg/dL (ref 8.4–10.5)
GFR, Est African American: >89 mL/min
GFR, Est Non African American: >89 mL/min
GLUCOSE: 88 mg/dL (ref 70–99)
POTASSIUM: 3.6 meq/L (ref 3.5–5.3)
Sodium: 139 mEq/L (ref 135–145)
Total Bilirubin: 0.6 mg/dL (ref 0.2–1.2)
Total Protein: 6.9 g/dL (ref 6.0–8.3)

## 2014-05-29 LAB — VITAMIN D 25 HYDROXY (VIT D DEFICIENCY, FRACTURES): Vit D, 25-Hydroxy: 15 ng/mL — ABNORMAL LOW (ref 30–89)

## 2014-05-29 LAB — TSH: TSH: 1.217 u[IU]/mL (ref 0.350–4.500)

## 2014-05-29 LAB — HEMOGLOBIN A1C
Hgb A1c MFr Bld: 5.1 % (ref <5.7)
Mean Plasma Glucose: 100 mg/dL (ref <117)

## 2014-07-08 ENCOUNTER — Encounter: Payer: Self-pay | Admitting: Internal Medicine

## 2020-01-23 ENCOUNTER — Ambulatory Visit: Payer: Self-pay | Attending: Nurse Practitioner | Admitting: Nurse Practitioner

## 2020-01-23 ENCOUNTER — Other Ambulatory Visit: Payer: Self-pay

## 2020-02-26 ENCOUNTER — Ambulatory Visit: Payer: Self-pay | Attending: Nurse Practitioner | Admitting: Nurse Practitioner

## 2020-02-26 ENCOUNTER — Other Ambulatory Visit: Payer: Self-pay

## 2020-02-26 ENCOUNTER — Encounter: Payer: Self-pay | Admitting: Nurse Practitioner

## 2020-02-26 VITALS — Ht 63.0 in | Wt 130.0 lb

## 2020-02-26 DIAGNOSIS — Z7689 Persons encountering health services in other specified circumstances: Secondary | ICD-10-CM

## 2020-02-26 DIAGNOSIS — Z13220 Encounter for screening for lipoid disorders: Secondary | ICD-10-CM

## 2020-02-26 DIAGNOSIS — Z131 Encounter for screening for diabetes mellitus: Secondary | ICD-10-CM

## 2020-02-26 DIAGNOSIS — Z30432 Encounter for removal of intrauterine contraceptive device: Secondary | ICD-10-CM

## 2020-02-26 DIAGNOSIS — Z130 Encounter for screening for diseases of the blood and blood-forming organs and certain disorders involving the immune mechanism: Secondary | ICD-10-CM

## 2020-02-26 DIAGNOSIS — Z13228 Encounter for screening for other metabolic disorders: Secondary | ICD-10-CM

## 2020-02-26 DIAGNOSIS — Z1329 Encounter for screening for other suspected endocrine disorder: Secondary | ICD-10-CM

## 2020-02-26 DIAGNOSIS — Z862 Personal history of diseases of the blood and blood-forming organs and certain disorders involving the immune mechanism: Secondary | ICD-10-CM

## 2020-02-26 NOTE — Addendum Note (Signed)
Addended byMemory Dance on: 02/26/2020 04:27 PM   Modules accepted: Orders

## 2020-02-26 NOTE — Progress Notes (Signed)
Virtual Visit via Telephone Note Due to national recommendations of social distancing due to [place] 19, telehealth visit is felt to be most appropriate for this patient at this time.  I discussed the limitations, risks, security and privacy concerns of performing an evaluation and management service by telephone and the availability of in person appointments. I also discussed with the patient that there may be a patient responsible charge related to this service. The patient expressed understanding and agreed to proceed.    I connected with Leslie Villarreal on 02/26/20  at   3:30 PM EDT  EDT by telephone and verified that I am speaking with the correct person using two identifiers.   Consent I discussed the limitations, risks, security and privacy concerns of performing an evaluation and management service by telephone and the availability of in person appointments. I also discussed with the patient that there may be a patient responsible charge related to this service. The patient expressed understanding and agreed to proceed.   Location of Patient: Private Residence   Location of Provider: [place] and [employer] Office    Persons participating in Telemedicine visit: [name] FNP-BC [place] Interpreter ID# [id number]   History of Present Illness: Telemedicine visit for: Establish Care  Requesting IUD removal. Will refer her to [facility] clinic for removal. She would like another IUD inserted after the current one is removed. No complications. IUD has expired. It was a 5 year device and has currently been in place for 7 years.   She has no other questions or concerns today. Denies chest pain, shortness of breath, palpitations, lightheadedness, dizziness, headaches or BLE edema.   Past Medical History:  Diagnosis Date   Abnormal Pap smear     Past Surgical History:  Procedure Laterality Date   LEEP     NO PAST SURGERIES       Family History  Problem Relation Age of Onset   Diabetes Paternal Grandfather    Diabetes Paternal Grandmother    Diabetes Father        patients father   Hypertension Maternal Grandmother    Stroke Maternal Grandfather     Social History   Socioeconomic History   Marital status: Married    Spouse name: Not on file   Number of children: Not on file   Years of education: Not on file   Highest education level: Not on file  Occupational History   Not on file  Tobacco Use   Smoking status: Never Smoker   Smokeless tobacco: Never Used  Substance and Sexual Activity   Alcohol use: No   Drug use: No   Sexual activity: Yes    Birth control/protection: None  Other Topics Concern   Not on file  Social History Narrative   Not on file   Social Determinants of Health   Financial Resource Strain:    Difficulty of Paying Living Expenses:   Food Insecurity:    Worried About Charity fundraiser in the Last Year:    Arboriculturist in the Last Year:   Transportation Needs:    Film/video editor (Medical):    Lack of Transportation (Non-Medical):   Physical Activity:    Days of Exercise per Week:    Minutes of Exercise per Session:   Stress:    Feeling of Stress :   Social Connections:    Frequency of Communication with Friends and Family:    Frequency of  Social Gatherings with Friends and Family:    Attends Religious Services:    Active Member of Clubs or Organizations:    Attends Music therapist:    Marital Status:      Observations/Objective: Awake, alert and oriented x 3   Review of Systems  Constitutional: Negative for fever, malaise/fatigue and weight loss.  HENT: Negative.  Negative for nosebleeds.   Eyes: Negative.  Negative for blurred vision, double vision and photophobia.  Respiratory: Negative.  Negative for cough and shortness of breath.   Cardiovascular: Negative.  Negative for chest pain, palpitations and  leg swelling.  Gastrointestinal: Negative.  Negative for heartburn, nausea and vomiting.  Musculoskeletal: Negative.  Negative for myalgias.  Neurological: Negative.  Negative for dizziness, focal weakness, seizures and headaches.  Psychiatric/Behavioral: Negative.  Negative for suicidal ideas.    Assessment and Plan: Heath was seen today for establish care.  Diagnoses and all orders for this visit:  Encounter to establish care  History of anemia -     CBC; Future  Screening for deficiency anemia -     CBC; Future  Encounter for screening for diabetes mellitus -     Hemoglobin A1c; Future  Screening for metabolic disorder -     ESL75+PYYF; Future  Thyroid disorder screening -     TSH; Future  Lipid screening -     Lipid panel; Future  Awaiting removal of contraceptive intrauterine device (IUD) -     Ambulatory referral to Gynecology     Follow Up Instructions Return if symptoms worsen or fail to improve.     I discussed the assessment and treatment plan with the patient. The patient was provided an opportunity to ask questions and all were answered. The patient agreed with the plan and demonstrated an understanding of the instructions.   The patient was advised to call back or seek an in-person evaluation if the symptoms worsen or if the condition fails to improve as anticipated.  I provided 14 minutes of non-face-to-face time during this encounter including median intraservice time, reviewing previous notes, labs, imaging, medications and explaining diagnosis and management.  Gildardo Pounds, FNP-BC

## 2020-02-27 LAB — CMP14+EGFR
ALT: 15 IU/L (ref 0–32)
AST: 18 IU/L (ref 0–40)
Albumin/Globulin Ratio: 1.6 (ref 1.2–2.2)
Albumin: 4.5 g/dL (ref 3.8–4.8)
Alkaline Phosphatase: 108 IU/L (ref 48–121)
BUN/Creatinine Ratio: 35 — ABNORMAL HIGH (ref 9–23)
BUN: 17 mg/dL (ref 6–20)
Bilirubin Total: 0.2 mg/dL (ref 0.0–1.2)
CO2: 23 mmol/L (ref 20–29)
Calcium: 9.3 mg/dL (ref 8.7–10.2)
Chloride: 106 mmol/L (ref 96–106)
Creatinine, Ser: 0.49 mg/dL — ABNORMAL LOW (ref 0.57–1.00)
GFR calc Af Amer: 146 mL/min/{1.73_m2} (ref >59)
GFR calc non Af Amer: 127 mL/min/{1.73_m2} (ref >59)
Globulin, Total: 2.8 g/dL (ref 1.5–4.5)
Glucose: 95 mg/dL (ref 65–99)
Potassium: 3.9 mmol/L (ref 3.5–5.2)
Sodium: 141 mmol/L (ref 134–144)
Total Protein: 7.3 g/dL (ref 6.0–8.5)

## 2020-02-27 LAB — LIPID PANEL
Chol/HDL Ratio: 3.7 ratio (ref 0.0–4.4)
Cholesterol, Total: 183 mg/dL (ref 100–199)
HDL: 50 mg/dL (ref >39)
LDL Chol Calc (NIH): 104 mg/dL — ABNORMAL HIGH (ref 0–99)
Triglycerides: 165 mg/dL — ABNORMAL HIGH (ref 0–149)
VLDL Cholesterol Cal: 29 mg/dL (ref 5–40)

## 2020-02-27 LAB — CBC
Hematocrit: 41.8 % (ref 34.0–46.6)
Hemoglobin: 13.3 g/dL (ref 11.1–15.9)
MCH: 27.8 pg (ref 26.6–33.0)
MCHC: 31.8 g/dL (ref 31.5–35.7)
MCV: 87 fL (ref 79–97)
Platelets: 192 10*3/uL (ref 150–450)
RBC: 4.78 x10E6/uL (ref 3.77–5.28)
RDW: 12.2 % (ref 11.7–15.4)
WBC: 9.1 10*3/uL (ref 3.4–10.8)

## 2020-02-27 LAB — TSH: TSH: 2.08 u[IU]/mL (ref 0.450–4.500)

## 2020-02-27 LAB — HEMOGLOBIN A1C
Est. average glucose Bld gHb Est-mCnc: 94 mg/dL
Hgb A1c MFr Bld: 4.9 % (ref 4.8–5.6)

## 2020-03-14 ENCOUNTER — Ambulatory Visit: Payer: Self-pay | Attending: Nurse Practitioner

## 2020-03-14 ENCOUNTER — Other Ambulatory Visit: Payer: Self-pay

## 2020-03-25 ENCOUNTER — Telehealth: Payer: Self-pay | Admitting: Nurse Practitioner

## 2020-03-25 NOTE — Telephone Encounter (Signed)
Pt was call since received an e-mail from [employer], Need 90 days of complete bank statement for this account  0394. Will hold for 14 days, Pt will bring it back as soon she can

## 2020-04-01 ENCOUNTER — Ambulatory Visit (INDEPENDENT_AMBULATORY_CARE_PROVIDER_SITE_OTHER): Payer: Self-pay | Admitting: Internal Medicine

## 2020-04-01 ENCOUNTER — Other Ambulatory Visit: Payer: Self-pay

## 2020-04-01 ENCOUNTER — Encounter: Payer: Self-pay | Admitting: Internal Medicine

## 2020-04-01 VITALS — BP 101/64 | HR 73 | Temp 97.3°F | Resp 17 | Wt 133.0 lb

## 2020-04-01 DIAGNOSIS — Z30433 Encounter for removal and reinsertion of intrauterine contraceptive device: Secondary | ICD-10-CM

## 2020-04-01 DIAGNOSIS — Z3202 Encounter for pregnancy test, result negative: Secondary | ICD-10-CM

## 2020-04-01 LAB — POCT URINE PREGNANCY: Preg Test, Ur: NEGATIVE

## 2020-04-01 MED ORDER — LEVONORGESTREL 20 MCG/24HR IU IUD
1.0000 | INTRAUTERINE_SYSTEM | Freq: Once | INTRAUTERINE | Status: AC
  Administered 2020-04-01: 1 via INTRAUTERINE

## 2020-04-01 NOTE — Progress Notes (Signed)
Subjective:    Leslie Villarreal - 34 y.o. female MRN [id number]  Date of birth: [birth year]  HPI   Leslie Villarreal is a 34 y.o. [id number] here for [facility] IUD removal and reinsertion. No GYN concerns.  Last pap smear was on June 2019 and was normal. Patient reports no concerns with IUD but states she believes has been in place for about 6 years. Has not been sexually active for about 5 months. Does have a history of LEEP.      Health Maintenance:  Health Maintenance Due  Topic Date Due   Hepatitis C Screening  Never done    -  reports that she has never smoked. She has never used smokeless tobacco. - Review of Systems: Per HPI. - Past Medical History: Patient Active Problem List   Diagnosis Date Noted   Family history of diabetes mellitus (DM) 05/28/2014   History of anemia 05/28/2014   CIN III (cervical intraepithelial neoplasia grade III) with severe dysplasia 05/04/2011   - Medications: reviewed and updated   Objective:   Physical Exam BP (!) 101/64    Pulse 73    Temp (!) 97.3 F (36.3 C) (Temporal)    Resp 17    Wt 133 lb (60.3 kg)    SpO2 98%    BMI 23.56 kg/m  Physical Exam Constitutional:      General: She is not in acute distress.    Appearance: She is not diaphoretic.  Cardiovascular:     Rate and Rhythm: Normal rate.  Pulmonary:     Effort: Pulmonary effort is normal. No respiratory distress.  Genitourinary:    Comments: GU/GYN: Exam performed in the presence of a chaperone. External genitalia within normal limits.  Vaginal mucosa pink, moist, normal rugae.  Nonfriable cervix without lesions, no discharge or bleeding noted on speculum exam. Some scar tissue visible around cervical os.      Musculoskeletal:        General: Normal range of motion.  Skin:    General: Skin is warm and dry.  Neurological:     Mental Status: She is alert and oriented to person, place, and time.  Psychiatric:        Mood and Affect: Affect normal.        Judgment:  Judgment normal.       IUD Removal and Reinsertion  Patient identified, informed consent performed, consent signed.   Discussed risks of irregular bleeding, cramping, infection, malpositioning or misplacement of the IUD outside the uterus which may require further procedures. Also advised to use backup contraception for one week as the risk of pregnancy is higher during the transition period of removing an IUD and replacing it with another one. Time out was performed. Speculum placed in the vagina. The strings of the IUD were grasped and pulled using ring forceps. The IUD was successfully removed in its entirety. The cervix was cleaned with Betadine x 2 and grasped anteriorly with a single tooth tenaculum.  The new Liletta IUD insertion apparatus was used to sound the uterus to 6 cm--some scar tissue and resistance was initially noted but able to proceed with sounding without significant resistance;  the IUD was then placed per manufacturer's recommendations. Strings trimmed to 3 cm. Tenaculum was removed, good hemostasis noted. Patient tolerated procedure well.      Assessment & Plan:   1. Encounter for IUD removal and reinsertion Upreg neg. Patient was given post-procedure instructions.  She was reminded to have backup contraception for  one week during this transition period between IUDs.  Patient was also asked to check IUD strings periodically and follow up in 4 weeks for IUD check. - levonorgestrel (MIRENA) 20 MCG/24HR IUD 1 each - POCT urine pregnancy     Marcy Siren, D.O. 04/01/2020, 2:56 PM Primary [facility] at [facility]

## 2020-04-02 ENCOUNTER — Ambulatory Visit: Payer: Self-pay | Admitting: Internal Medicine

## 2020-04-15 ENCOUNTER — Ambulatory Visit: Payer: Self-pay | Admitting: Nurse Practitioner

## 2020-05-07 ENCOUNTER — Ambulatory Visit: Payer: Self-pay | Admitting: Internal Medicine
